# Patient Record
Sex: Female | Born: 1935 | Race: Black or African American | Hispanic: No | Smoking: Never smoker
Health system: Southern US, Community
[De-identification: ages and names within clinical notes are randomized; demographics above are authoritative.]

## PROBLEM LIST (undated history)

## (undated) DIAGNOSIS — C189 Malignant neoplasm of colon, unspecified: Secondary | ICD-10-CM

## (undated) DIAGNOSIS — I639 Cerebral infarction, unspecified: Secondary | ICD-10-CM

---

## 2011-09-02 DIAGNOSIS — D649 Anemia, unspecified: Secondary | ICD-10-CM | POA: Diagnosis not present

## 2011-09-02 DIAGNOSIS — I10 Essential (primary) hypertension: Secondary | ICD-10-CM | POA: Diagnosis not present

## 2011-09-02 DIAGNOSIS — Z79899 Other long term (current) drug therapy: Secondary | ICD-10-CM | POA: Diagnosis not present

## 2011-11-10 DIAGNOSIS — C189 Malignant neoplasm of colon, unspecified: Secondary | ICD-10-CM | POA: Diagnosis not present

## 2011-12-04 DIAGNOSIS — R5383 Other fatigue: Secondary | ICD-10-CM | POA: Diagnosis not present

## 2011-12-04 DIAGNOSIS — I10 Essential (primary) hypertension: Secondary | ICD-10-CM | POA: Diagnosis not present

## 2011-12-04 DIAGNOSIS — D649 Anemia, unspecified: Secondary | ICD-10-CM | POA: Diagnosis not present

## 2011-12-04 DIAGNOSIS — R5381 Other malaise: Secondary | ICD-10-CM | POA: Diagnosis not present

## 2011-12-25 DIAGNOSIS — M6281 Muscle weakness (generalized): Secondary | ICD-10-CM | POA: Diagnosis not present

## 2011-12-25 DIAGNOSIS — D649 Anemia, unspecified: Secondary | ICD-10-CM | POA: Diagnosis not present

## 2011-12-25 DIAGNOSIS — I10 Essential (primary) hypertension: Secondary | ICD-10-CM | POA: Diagnosis not present

## 2012-02-16 DIAGNOSIS — C189 Malignant neoplasm of colon, unspecified: Secondary | ICD-10-CM | POA: Diagnosis not present

## 2012-03-21 DIAGNOSIS — C189 Malignant neoplasm of colon, unspecified: Secondary | ICD-10-CM | POA: Diagnosis not present

## 2012-03-21 DIAGNOSIS — D649 Anemia, unspecified: Secondary | ICD-10-CM | POA: Diagnosis not present

## 2012-03-21 DIAGNOSIS — M6281 Muscle weakness (generalized): Secondary | ICD-10-CM | POA: Diagnosis not present

## 2012-04-11 DIAGNOSIS — R5381 Other malaise: Secondary | ICD-10-CM | POA: Diagnosis not present

## 2012-04-11 DIAGNOSIS — R5383 Other fatigue: Secondary | ICD-10-CM | POA: Diagnosis not present

## 2012-04-11 DIAGNOSIS — R030 Elevated blood-pressure reading, without diagnosis of hypertension: Secondary | ICD-10-CM | POA: Diagnosis not present

## 2012-04-11 DIAGNOSIS — M6281 Muscle weakness (generalized): Secondary | ICD-10-CM | POA: Diagnosis not present

## 2012-04-13 DIAGNOSIS — I10 Essential (primary) hypertension: Secondary | ICD-10-CM | POA: Diagnosis not present

## 2012-04-13 DIAGNOSIS — N390 Urinary tract infection, site not specified: Secondary | ICD-10-CM | POA: Diagnosis not present

## 2012-04-15 DIAGNOSIS — N390 Urinary tract infection, site not specified: Secondary | ICD-10-CM | POA: Diagnosis not present

## 2012-06-22 DIAGNOSIS — I10 Essential (primary) hypertension: Secondary | ICD-10-CM | POA: Diagnosis not present

## 2012-06-22 DIAGNOSIS — D649 Anemia, unspecified: Secondary | ICD-10-CM | POA: Diagnosis not present

## 2012-10-06 DIAGNOSIS — M6281 Muscle weakness (generalized): Secondary | ICD-10-CM | POA: Diagnosis not present

## 2012-10-06 DIAGNOSIS — R5381 Other malaise: Secondary | ICD-10-CM | POA: Diagnosis not present

## 2012-10-06 DIAGNOSIS — R5383 Other fatigue: Secondary | ICD-10-CM | POA: Diagnosis not present

## 2012-11-13 DIAGNOSIS — I4510 Unspecified right bundle-branch block: Secondary | ICD-10-CM | POA: Diagnosis not present

## 2012-11-13 DIAGNOSIS — Z85038 Personal history of other malignant neoplasm of large intestine: Secondary | ICD-10-CM | POA: Diagnosis not present

## 2012-11-13 DIAGNOSIS — R5381 Other malaise: Secondary | ICD-10-CM | POA: Diagnosis not present

## 2012-11-13 DIAGNOSIS — I10 Essential (primary) hypertension: Secondary | ICD-10-CM | POA: Diagnosis not present

## 2012-11-13 DIAGNOSIS — Z9119 Patient's noncompliance with other medical treatment and regimen: Secondary | ICD-10-CM | POA: Diagnosis not present

## 2012-11-13 DIAGNOSIS — I498 Other specified cardiac arrhythmias: Secondary | ICD-10-CM | POA: Diagnosis not present

## 2012-11-13 DIAGNOSIS — Z9049 Acquired absence of other specified parts of digestive tract: Secondary | ICD-10-CM | POA: Diagnosis not present

## 2012-11-13 DIAGNOSIS — R269 Unspecified abnormalities of gait and mobility: Secondary | ICD-10-CM | POA: Diagnosis not present

## 2012-11-14 DIAGNOSIS — I498 Other specified cardiac arrhythmias: Secondary | ICD-10-CM | POA: Diagnosis not present

## 2012-11-14 DIAGNOSIS — I4510 Unspecified right bundle-branch block: Secondary | ICD-10-CM | POA: Diagnosis not present

## 2012-12-20 DIAGNOSIS — C189 Malignant neoplasm of colon, unspecified: Secondary | ICD-10-CM | POA: Diagnosis not present

## 2013-03-23 DIAGNOSIS — C189 Malignant neoplasm of colon, unspecified: Secondary | ICD-10-CM | POA: Diagnosis not present

## 2013-06-09 DIAGNOSIS — C189 Malignant neoplasm of colon, unspecified: Secondary | ICD-10-CM | POA: Diagnosis not present

## 2013-12-26 DIAGNOSIS — C189 Malignant neoplasm of colon, unspecified: Secondary | ICD-10-CM | POA: Diagnosis not present

## 2014-01-14 DIAGNOSIS — R5383 Other fatigue: Secondary | ICD-10-CM | POA: Diagnosis not present

## 2014-01-14 DIAGNOSIS — I609 Nontraumatic subarachnoid hemorrhage, unspecified: Secondary | ICD-10-CM | POA: Diagnosis not present

## 2014-01-14 DIAGNOSIS — I10 Essential (primary) hypertension: Secondary | ICD-10-CM | POA: Diagnosis not present

## 2014-01-14 DIAGNOSIS — G40209 Localization-related (focal) (partial) symptomatic epilepsy and epileptic syndromes with complex partial seizures, not intractable, without status epilepticus: Secondary | ICD-10-CM | POA: Diagnosis not present

## 2014-01-14 DIAGNOSIS — S066X0A Traumatic subarachnoid hemorrhage without loss of consciousness, initial encounter: Secondary | ICD-10-CM | POA: Diagnosis not present

## 2014-01-14 DIAGNOSIS — Z9181 History of falling: Secondary | ICD-10-CM | POA: Diagnosis not present

## 2014-01-14 DIAGNOSIS — I6350 Cerebral infarction due to unspecified occlusion or stenosis of unspecified cerebral artery: Secondary | ICD-10-CM | POA: Diagnosis not present

## 2014-01-14 DIAGNOSIS — R1310 Dysphagia, unspecified: Secondary | ICD-10-CM | POA: Diagnosis not present

## 2014-01-14 DIAGNOSIS — I729 Aneurysm of unspecified site: Secondary | ICD-10-CM | POA: Diagnosis not present

## 2014-01-14 DIAGNOSIS — Z66 Do not resuscitate: Secondary | ICD-10-CM | POA: Diagnosis present

## 2014-01-14 DIAGNOSIS — I4510 Unspecified right bundle-branch block: Secondary | ICD-10-CM | POA: Diagnosis not present

## 2014-01-14 DIAGNOSIS — R404 Transient alteration of awareness: Secondary | ICD-10-CM | POA: Diagnosis not present

## 2014-01-14 DIAGNOSIS — G8190 Hemiplegia, unspecified affecting unspecified side: Secondary | ICD-10-CM | POA: Diagnosis not present

## 2014-01-14 DIAGNOSIS — R410 Disorientation, unspecified: Secondary | ICD-10-CM | POA: Diagnosis not present

## 2014-01-14 DIAGNOSIS — R5381 Other malaise: Secondary | ICD-10-CM | POA: Diagnosis not present

## 2014-01-14 DIAGNOSIS — I619 Nontraumatic intracerebral hemorrhage, unspecified: Secondary | ICD-10-CM | POA: Diagnosis not present

## 2014-01-14 DIAGNOSIS — Z85038 Personal history of other malignant neoplasm of large intestine: Secondary | ICD-10-CM | POA: Diagnosis not present

## 2014-01-14 DIAGNOSIS — S066X9A Traumatic subarachnoid hemorrhage with loss of consciousness of unspecified duration, initial encounter: Secondary | ICD-10-CM | POA: Diagnosis not present

## 2014-01-14 DIAGNOSIS — R269 Unspecified abnormalities of gait and mobility: Secondary | ICD-10-CM | POA: Diagnosis not present

## 2014-01-14 DIAGNOSIS — I498 Other specified cardiac arrhythmias: Secondary | ICD-10-CM | POA: Diagnosis not present

## 2014-01-14 DIAGNOSIS — S066XAA Traumatic subarachnoid hemorrhage with loss of consciousness status unknown, initial encounter: Secondary | ICD-10-CM | POA: Diagnosis not present

## 2014-01-14 DIAGNOSIS — I4589 Other specified conduction disorders: Secondary | ICD-10-CM | POA: Diagnosis not present

## 2014-01-14 DIAGNOSIS — I6200 Nontraumatic subdural hemorrhage, unspecified: Secondary | ICD-10-CM | POA: Diagnosis not present

## 2014-01-14 DIAGNOSIS — Z9049 Acquired absence of other specified parts of digestive tract: Secondary | ICD-10-CM | POA: Diagnosis not present

## 2014-01-14 DIAGNOSIS — R569 Unspecified convulsions: Secondary | ICD-10-CM | POA: Diagnosis not present

## 2014-01-19 DIAGNOSIS — G40209 Localization-related (focal) (partial) symptomatic epilepsy and epileptic syndromes with complex partial seizures, not intractable, without status epilepticus: Secondary | ICD-10-CM | POA: Diagnosis not present

## 2014-01-19 DIAGNOSIS — G08 Intracranial and intraspinal phlebitis and thrombophlebitis: Secondary | ICD-10-CM | POA: Diagnosis not present

## 2014-01-19 DIAGNOSIS — N390 Urinary tract infection, site not specified: Secondary | ICD-10-CM | POA: Diagnosis not present

## 2014-01-19 DIAGNOSIS — Z85038 Personal history of other malignant neoplasm of large intestine: Secondary | ICD-10-CM | POA: Diagnosis not present

## 2014-01-19 DIAGNOSIS — Z5189 Encounter for other specified aftercare: Secondary | ICD-10-CM | POA: Diagnosis not present

## 2014-01-19 DIAGNOSIS — R040 Epistaxis: Secondary | ICD-10-CM | POA: Diagnosis present

## 2014-01-19 DIAGNOSIS — I609 Nontraumatic subarachnoid hemorrhage, unspecified: Secondary | ICD-10-CM | POA: Diagnosis not present

## 2014-01-19 DIAGNOSIS — M6281 Muscle weakness (generalized): Secondary | ICD-10-CM | POA: Diagnosis not present

## 2014-01-19 DIAGNOSIS — I10 Essential (primary) hypertension: Secondary | ICD-10-CM | POA: Diagnosis not present

## 2014-01-19 DIAGNOSIS — K209 Esophagitis, unspecified without bleeding: Secondary | ICD-10-CM | POA: Diagnosis present

## 2014-01-19 DIAGNOSIS — R1310 Dysphagia, unspecified: Secondary | ICD-10-CM | POA: Diagnosis not present

## 2014-01-19 DIAGNOSIS — S066XAA Traumatic subarachnoid hemorrhage with loss of consciousness status unknown, initial encounter: Secondary | ICD-10-CM | POA: Diagnosis not present

## 2014-01-19 DIAGNOSIS — R1319 Other dysphagia: Secondary | ICD-10-CM | POA: Diagnosis present

## 2014-01-19 DIAGNOSIS — S065XAA Traumatic subdural hemorrhage with loss of consciousness status unknown, initial encounter: Secondary | ICD-10-CM | POA: Diagnosis not present

## 2014-01-19 DIAGNOSIS — I69998 Other sequelae following unspecified cerebrovascular disease: Secondary | ICD-10-CM | POA: Diagnosis not present

## 2014-01-19 DIAGNOSIS — I6789 Other cerebrovascular disease: Secondary | ICD-10-CM | POA: Diagnosis not present

## 2014-01-19 DIAGNOSIS — Z8673 Personal history of transient ischemic attack (TIA), and cerebral infarction without residual deficits: Secondary | ICD-10-CM | POA: Diagnosis not present

## 2014-01-19 DIAGNOSIS — Z9221 Personal history of antineoplastic chemotherapy: Secondary | ICD-10-CM | POA: Diagnosis not present

## 2014-01-19 DIAGNOSIS — F05 Delirium due to known physiological condition: Secondary | ICD-10-CM | POA: Diagnosis not present

## 2014-01-19 DIAGNOSIS — I6200 Nontraumatic subdural hemorrhage, unspecified: Secondary | ICD-10-CM | POA: Diagnosis not present

## 2014-01-19 DIAGNOSIS — Z4682 Encounter for fitting and adjustment of non-vascular catheter: Secondary | ICD-10-CM | POA: Diagnosis not present

## 2014-01-19 DIAGNOSIS — R569 Unspecified convulsions: Secondary | ICD-10-CM | POA: Diagnosis not present

## 2014-01-19 DIAGNOSIS — R279 Unspecified lack of coordination: Secondary | ICD-10-CM | POA: Diagnosis not present

## 2014-01-19 DIAGNOSIS — Z79899 Other long term (current) drug therapy: Secondary | ICD-10-CM | POA: Diagnosis not present

## 2014-01-19 DIAGNOSIS — I619 Nontraumatic intracerebral hemorrhage, unspecified: Secondary | ICD-10-CM | POA: Diagnosis not present

## 2014-01-19 DIAGNOSIS — C189 Malignant neoplasm of colon, unspecified: Secondary | ICD-10-CM | POA: Diagnosis not present

## 2014-01-19 DIAGNOSIS — S066X9A Traumatic subarachnoid hemorrhage with loss of consciousness of unspecified duration, initial encounter: Secondary | ICD-10-CM | POA: Diagnosis present

## 2014-01-19 DIAGNOSIS — S065X9A Traumatic subdural hemorrhage with loss of consciousness of unspecified duration, initial encounter: Secondary | ICD-10-CM | POA: Diagnosis not present

## 2014-01-19 DIAGNOSIS — G9340 Encephalopathy, unspecified: Secondary | ICD-10-CM | POA: Diagnosis present

## 2014-01-19 DIAGNOSIS — R1312 Dysphagia, oropharyngeal phase: Secondary | ICD-10-CM | POA: Diagnosis not present

## 2014-02-05 DIAGNOSIS — I10 Essential (primary) hypertension: Secondary | ICD-10-CM | POA: Diagnosis not present

## 2014-02-05 DIAGNOSIS — R1310 Dysphagia, unspecified: Secondary | ICD-10-CM | POA: Diagnosis not present

## 2014-02-05 DIAGNOSIS — Z7189 Other specified counseling: Secondary | ICD-10-CM | POA: Diagnosis not present

## 2014-02-05 DIAGNOSIS — R1311 Dysphagia, oral phase: Secondary | ICD-10-CM | POA: Diagnosis not present

## 2014-02-05 DIAGNOSIS — I639 Cerebral infarction, unspecified: Secondary | ICD-10-CM | POA: Diagnosis not present

## 2014-02-05 DIAGNOSIS — R633 Feeding difficulties: Secondary | ICD-10-CM | POA: Diagnosis not present

## 2014-02-05 DIAGNOSIS — I6789 Other cerebrovascular disease: Secondary | ICD-10-CM | POA: Diagnosis not present

## 2014-02-05 DIAGNOSIS — Z5189 Encounter for other specified aftercare: Secondary | ICD-10-CM | POA: Diagnosis not present

## 2014-02-05 DIAGNOSIS — K121 Other forms of stomatitis: Secondary | ICD-10-CM | POA: Diagnosis not present

## 2014-02-05 DIAGNOSIS — M19049 Primary osteoarthritis, unspecified hand: Secondary | ICD-10-CM | POA: Diagnosis not present

## 2014-02-05 DIAGNOSIS — R1312 Dysphagia, oropharyngeal phase: Secondary | ICD-10-CM | POA: Diagnosis not present

## 2014-02-05 DIAGNOSIS — I6990 Unspecified sequelae of unspecified cerebrovascular disease: Secondary | ICD-10-CM | POA: Diagnosis not present

## 2014-02-05 DIAGNOSIS — R0989 Other specified symptoms and signs involving the circulatory and respiratory systems: Secondary | ICD-10-CM | POA: Diagnosis not present

## 2014-02-05 DIAGNOSIS — E7800 Pure hypercholesterolemia, unspecified: Secondary | ICD-10-CM | POA: Diagnosis not present

## 2014-02-05 DIAGNOSIS — L8990 Pressure ulcer of unspecified site, unspecified stage: Secondary | ICD-10-CM | POA: Diagnosis not present

## 2014-02-05 DIAGNOSIS — R059 Cough, unspecified: Secondary | ICD-10-CM | POA: Diagnosis not present

## 2014-02-05 DIAGNOSIS — L89109 Pressure ulcer of unspecified part of back, unspecified stage: Secondary | ICD-10-CM | POA: Diagnosis not present

## 2014-02-05 DIAGNOSIS — I69998 Other sequelae following unspecified cerebrovascular disease: Secondary | ICD-10-CM | POA: Diagnosis not present

## 2014-02-05 DIAGNOSIS — I69320 Aphasia following cerebral infarction: Secondary | ICD-10-CM | POA: Diagnosis not present

## 2014-02-05 DIAGNOSIS — I609 Nontraumatic subarachnoid hemorrhage, unspecified: Secondary | ICD-10-CM | POA: Diagnosis not present

## 2014-02-05 DIAGNOSIS — F05 Delirium due to known physiological condition: Secondary | ICD-10-CM | POA: Diagnosis not present

## 2014-02-05 DIAGNOSIS — G08 Intracranial and intraspinal phlebitis and thrombophlebitis: Secondary | ICD-10-CM | POA: Diagnosis not present

## 2014-02-05 DIAGNOSIS — R279 Unspecified lack of coordination: Secondary | ICD-10-CM | POA: Diagnosis not present

## 2014-02-05 DIAGNOSIS — M25449 Effusion, unspecified hand: Secondary | ICD-10-CM | POA: Diagnosis not present

## 2014-02-05 DIAGNOSIS — M25539 Pain in unspecified wrist: Secondary | ICD-10-CM | POA: Diagnosis not present

## 2014-02-05 DIAGNOSIS — M6281 Muscle weakness (generalized): Secondary | ICD-10-CM | POA: Diagnosis not present

## 2014-02-05 DIAGNOSIS — C189 Malignant neoplasm of colon, unspecified: Secondary | ICD-10-CM | POA: Diagnosis not present

## 2014-02-05 DIAGNOSIS — N390 Urinary tract infection, site not specified: Secondary | ICD-10-CM | POA: Diagnosis not present

## 2014-02-07 DIAGNOSIS — I6990 Unspecified sequelae of unspecified cerebrovascular disease: Secondary | ICD-10-CM | POA: Diagnosis not present

## 2014-02-07 DIAGNOSIS — I10 Essential (primary) hypertension: Secondary | ICD-10-CM | POA: Diagnosis not present

## 2014-02-11 DIAGNOSIS — R0989 Other specified symptoms and signs involving the circulatory and respiratory systems: Secondary | ICD-10-CM | POA: Diagnosis not present

## 2014-02-11 DIAGNOSIS — R05 Cough: Secondary | ICD-10-CM | POA: Diagnosis not present

## 2014-02-19 DIAGNOSIS — L8990 Pressure ulcer of unspecified site, unspecified stage: Secondary | ICD-10-CM | POA: Diagnosis not present

## 2014-02-19 DIAGNOSIS — L89109 Pressure ulcer of unspecified part of back, unspecified stage: Secondary | ICD-10-CM | POA: Diagnosis not present

## 2014-02-26 DIAGNOSIS — E7800 Pure hypercholesterolemia, unspecified: Secondary | ICD-10-CM | POA: Diagnosis not present

## 2014-03-04 DIAGNOSIS — M25449 Effusion, unspecified hand: Secondary | ICD-10-CM | POA: Diagnosis not present

## 2014-03-04 DIAGNOSIS — M19049 Primary osteoarthritis, unspecified hand: Secondary | ICD-10-CM | POA: Diagnosis not present

## 2014-03-06 DIAGNOSIS — Z7189 Other specified counseling: Secondary | ICD-10-CM | POA: Diagnosis not present

## 2014-03-06 DIAGNOSIS — I10 Essential (primary) hypertension: Secondary | ICD-10-CM | POA: Diagnosis not present

## 2014-03-06 DIAGNOSIS — I69998 Other sequelae following unspecified cerebrovascular disease: Secondary | ICD-10-CM | POA: Diagnosis not present

## 2014-03-06 DIAGNOSIS — Z5189 Encounter for other specified aftercare: Secondary | ICD-10-CM | POA: Diagnosis not present

## 2014-03-06 DIAGNOSIS — L8990 Pressure ulcer of unspecified site, unspecified stage: Secondary | ICD-10-CM | POA: Diagnosis not present

## 2014-03-06 DIAGNOSIS — M25539 Pain in unspecified wrist: Secondary | ICD-10-CM | POA: Diagnosis not present

## 2014-03-06 DIAGNOSIS — R0989 Other specified symptoms and signs involving the circulatory and respiratory systems: Secondary | ICD-10-CM | POA: Diagnosis not present

## 2014-03-06 DIAGNOSIS — G08 Intracranial and intraspinal phlebitis and thrombophlebitis: Secondary | ICD-10-CM | POA: Diagnosis not present

## 2014-03-06 DIAGNOSIS — R1312 Dysphagia, oropharyngeal phase: Secondary | ICD-10-CM | POA: Diagnosis not present

## 2014-03-06 DIAGNOSIS — L89109 Pressure ulcer of unspecified part of back, unspecified stage: Secondary | ICD-10-CM | POA: Diagnosis not present

## 2014-03-06 DIAGNOSIS — I609 Nontraumatic subarachnoid hemorrhage, unspecified: Secondary | ICD-10-CM | POA: Diagnosis not present

## 2014-03-06 DIAGNOSIS — C189 Malignant neoplasm of colon, unspecified: Secondary | ICD-10-CM | POA: Diagnosis not present

## 2014-03-06 DIAGNOSIS — R279 Unspecified lack of coordination: Secondary | ICD-10-CM | POA: Diagnosis not present

## 2014-03-06 DIAGNOSIS — M6281 Muscle weakness (generalized): Secondary | ICD-10-CM | POA: Diagnosis not present

## 2014-03-06 DIAGNOSIS — I6789 Other cerebrovascular disease: Secondary | ICD-10-CM | POA: Diagnosis not present

## 2014-03-06 DIAGNOSIS — R633 Feeding difficulties: Secondary | ICD-10-CM | POA: Diagnosis not present

## 2014-03-06 DIAGNOSIS — I639 Cerebral infarction, unspecified: Secondary | ICD-10-CM | POA: Diagnosis not present

## 2014-03-06 DIAGNOSIS — R1311 Dysphagia, oral phase: Secondary | ICD-10-CM | POA: Diagnosis not present

## 2014-03-06 DIAGNOSIS — K121 Other forms of stomatitis: Secondary | ICD-10-CM | POA: Diagnosis not present

## 2014-03-06 DIAGNOSIS — I69320 Aphasia following cerebral infarction: Secondary | ICD-10-CM | POA: Diagnosis not present

## 2014-03-06 DIAGNOSIS — I6990 Unspecified sequelae of unspecified cerebrovascular disease: Secondary | ICD-10-CM | POA: Diagnosis not present

## 2014-03-06 DIAGNOSIS — R1310 Dysphagia, unspecified: Secondary | ICD-10-CM | POA: Diagnosis not present

## 2014-03-15 DIAGNOSIS — I6990 Unspecified sequelae of unspecified cerebrovascular disease: Secondary | ICD-10-CM | POA: Diagnosis not present

## 2014-03-15 DIAGNOSIS — L8990 Pressure ulcer of unspecified site, unspecified stage: Secondary | ICD-10-CM | POA: Diagnosis not present

## 2014-03-15 DIAGNOSIS — L89109 Pressure ulcer of unspecified part of back, unspecified stage: Secondary | ICD-10-CM | POA: Diagnosis not present

## 2014-04-19 DIAGNOSIS — Z7189 Other specified counseling: Secondary | ICD-10-CM | POA: Diagnosis not present

## 2014-04-19 DIAGNOSIS — I69320 Aphasia following cerebral infarction: Secondary | ICD-10-CM | POA: Diagnosis not present

## 2014-05-03 ENCOUNTER — Other Ambulatory Visit (HOSPITAL_COMMUNITY): Payer: Self-pay | Admitting: Geriatric Medicine

## 2014-05-03 DIAGNOSIS — R1310 Dysphagia, unspecified: Secondary | ICD-10-CM

## 2014-05-09 ENCOUNTER — Ambulatory Visit (HOSPITAL_COMMUNITY): Admission: RE | Admit: 2014-05-09 | Payer: Self-pay | Source: Ambulatory Visit

## 2014-05-09 ENCOUNTER — Inpatient Hospital Stay (HOSPITAL_COMMUNITY): Admission: RE | Admit: 2014-05-09 | Payer: Self-pay | Source: Ambulatory Visit

## 2014-05-09 DIAGNOSIS — R633 Feeding difficulties: Secondary | ICD-10-CM | POA: Diagnosis not present

## 2014-05-09 DIAGNOSIS — R1311 Dysphagia, oral phase: Secondary | ICD-10-CM | POA: Diagnosis not present

## 2014-05-15 DIAGNOSIS — I609 Nontraumatic subarachnoid hemorrhage, unspecified: Secondary | ICD-10-CM | POA: Diagnosis not present

## 2014-05-15 DIAGNOSIS — C189 Malignant neoplasm of colon, unspecified: Secondary | ICD-10-CM | POA: Diagnosis not present

## 2014-05-15 DIAGNOSIS — I10 Essential (primary) hypertension: Secondary | ICD-10-CM | POA: Diagnosis not present

## 2014-05-15 DIAGNOSIS — G08 Intracranial and intraspinal phlebitis and thrombophlebitis: Secondary | ICD-10-CM | POA: Diagnosis not present

## 2014-05-16 DIAGNOSIS — R279 Unspecified lack of coordination: Secondary | ICD-10-CM | POA: Diagnosis not present

## 2014-05-16 DIAGNOSIS — I6789 Other cerebrovascular disease: Secondary | ICD-10-CM | POA: Diagnosis not present

## 2014-05-16 DIAGNOSIS — M6281 Muscle weakness (generalized): Secondary | ICD-10-CM | POA: Diagnosis not present

## 2014-05-16 DIAGNOSIS — I69998 Other sequelae following unspecified cerebrovascular disease: Secondary | ICD-10-CM | POA: Diagnosis not present

## 2014-05-16 DIAGNOSIS — R1310 Dysphagia, unspecified: Secondary | ICD-10-CM | POA: Diagnosis not present

## 2014-05-16 DIAGNOSIS — R1311 Dysphagia, oral phase: Secondary | ICD-10-CM | POA: Diagnosis not present

## 2014-05-17 DIAGNOSIS — R1310 Dysphagia, unspecified: Secondary | ICD-10-CM | POA: Diagnosis not present

## 2014-05-17 DIAGNOSIS — R279 Unspecified lack of coordination: Secondary | ICD-10-CM | POA: Diagnosis not present

## 2014-05-17 DIAGNOSIS — M6281 Muscle weakness (generalized): Secondary | ICD-10-CM | POA: Diagnosis not present

## 2014-05-17 DIAGNOSIS — I69998 Other sequelae following unspecified cerebrovascular disease: Secondary | ICD-10-CM | POA: Diagnosis not present

## 2014-05-17 DIAGNOSIS — R1311 Dysphagia, oral phase: Secondary | ICD-10-CM | POA: Diagnosis not present

## 2014-05-17 DIAGNOSIS — I6789 Other cerebrovascular disease: Secondary | ICD-10-CM | POA: Diagnosis not present

## 2014-05-18 DIAGNOSIS — R1310 Dysphagia, unspecified: Secondary | ICD-10-CM | POA: Diagnosis not present

## 2014-05-18 DIAGNOSIS — R1311 Dysphagia, oral phase: Secondary | ICD-10-CM | POA: Diagnosis not present

## 2014-05-18 DIAGNOSIS — I69998 Other sequelae following unspecified cerebrovascular disease: Secondary | ICD-10-CM | POA: Diagnosis not present

## 2014-05-18 DIAGNOSIS — I6789 Other cerebrovascular disease: Secondary | ICD-10-CM | POA: Diagnosis not present

## 2014-05-18 DIAGNOSIS — R279 Unspecified lack of coordination: Secondary | ICD-10-CM | POA: Diagnosis not present

## 2014-05-18 DIAGNOSIS — M6281 Muscle weakness (generalized): Secondary | ICD-10-CM | POA: Diagnosis not present

## 2014-05-21 DIAGNOSIS — R1311 Dysphagia, oral phase: Secondary | ICD-10-CM | POA: Diagnosis not present

## 2014-05-21 DIAGNOSIS — R279 Unspecified lack of coordination: Secondary | ICD-10-CM | POA: Diagnosis not present

## 2014-05-21 DIAGNOSIS — I69998 Other sequelae following unspecified cerebrovascular disease: Secondary | ICD-10-CM | POA: Diagnosis not present

## 2014-05-21 DIAGNOSIS — R1310 Dysphagia, unspecified: Secondary | ICD-10-CM | POA: Diagnosis not present

## 2014-05-21 DIAGNOSIS — M6281 Muscle weakness (generalized): Secondary | ICD-10-CM | POA: Diagnosis not present

## 2014-05-21 DIAGNOSIS — I6789 Other cerebrovascular disease: Secondary | ICD-10-CM | POA: Diagnosis not present

## 2014-05-22 DIAGNOSIS — M6281 Muscle weakness (generalized): Secondary | ICD-10-CM | POA: Diagnosis not present

## 2014-05-22 DIAGNOSIS — I6789 Other cerebrovascular disease: Secondary | ICD-10-CM | POA: Diagnosis not present

## 2014-05-22 DIAGNOSIS — I69998 Other sequelae following unspecified cerebrovascular disease: Secondary | ICD-10-CM | POA: Diagnosis not present

## 2014-05-22 DIAGNOSIS — R1311 Dysphagia, oral phase: Secondary | ICD-10-CM | POA: Diagnosis not present

## 2014-05-22 DIAGNOSIS — R1310 Dysphagia, unspecified: Secondary | ICD-10-CM | POA: Diagnosis not present

## 2014-05-22 DIAGNOSIS — R279 Unspecified lack of coordination: Secondary | ICD-10-CM | POA: Diagnosis not present

## 2014-05-23 DIAGNOSIS — R279 Unspecified lack of coordination: Secondary | ICD-10-CM | POA: Diagnosis not present

## 2014-05-23 DIAGNOSIS — M6281 Muscle weakness (generalized): Secondary | ICD-10-CM | POA: Diagnosis not present

## 2014-05-23 DIAGNOSIS — I69998 Other sequelae following unspecified cerebrovascular disease: Secondary | ICD-10-CM | POA: Diagnosis not present

## 2014-05-23 DIAGNOSIS — R1311 Dysphagia, oral phase: Secondary | ICD-10-CM | POA: Diagnosis not present

## 2014-05-23 DIAGNOSIS — I6789 Other cerebrovascular disease: Secondary | ICD-10-CM | POA: Diagnosis not present

## 2014-05-23 DIAGNOSIS — R1310 Dysphagia, unspecified: Secondary | ICD-10-CM | POA: Diagnosis not present

## 2014-05-24 DIAGNOSIS — M6281 Muscle weakness (generalized): Secondary | ICD-10-CM | POA: Diagnosis not present

## 2014-05-24 DIAGNOSIS — I6789 Other cerebrovascular disease: Secondary | ICD-10-CM | POA: Diagnosis not present

## 2014-05-24 DIAGNOSIS — R279 Unspecified lack of coordination: Secondary | ICD-10-CM | POA: Diagnosis not present

## 2014-05-24 DIAGNOSIS — I69998 Other sequelae following unspecified cerebrovascular disease: Secondary | ICD-10-CM | POA: Diagnosis not present

## 2014-05-24 DIAGNOSIS — R1310 Dysphagia, unspecified: Secondary | ICD-10-CM | POA: Diagnosis not present

## 2014-05-24 DIAGNOSIS — R1311 Dysphagia, oral phase: Secondary | ICD-10-CM | POA: Diagnosis not present

## 2014-05-25 DIAGNOSIS — R1310 Dysphagia, unspecified: Secondary | ICD-10-CM | POA: Diagnosis not present

## 2014-05-25 DIAGNOSIS — M6281 Muscle weakness (generalized): Secondary | ICD-10-CM | POA: Diagnosis not present

## 2014-05-25 DIAGNOSIS — R279 Unspecified lack of coordination: Secondary | ICD-10-CM | POA: Diagnosis not present

## 2014-05-25 DIAGNOSIS — R1311 Dysphagia, oral phase: Secondary | ICD-10-CM | POA: Diagnosis not present

## 2014-05-25 DIAGNOSIS — I6789 Other cerebrovascular disease: Secondary | ICD-10-CM | POA: Diagnosis not present

## 2014-05-25 DIAGNOSIS — I69998 Other sequelae following unspecified cerebrovascular disease: Secondary | ICD-10-CM | POA: Diagnosis not present

## 2014-05-26 DIAGNOSIS — I6789 Other cerebrovascular disease: Secondary | ICD-10-CM | POA: Diagnosis not present

## 2014-05-26 DIAGNOSIS — R279 Unspecified lack of coordination: Secondary | ICD-10-CM | POA: Diagnosis not present

## 2014-05-26 DIAGNOSIS — R1310 Dysphagia, unspecified: Secondary | ICD-10-CM | POA: Diagnosis not present

## 2014-05-26 DIAGNOSIS — M6281 Muscle weakness (generalized): Secondary | ICD-10-CM | POA: Diagnosis not present

## 2014-05-26 DIAGNOSIS — R1311 Dysphagia, oral phase: Secondary | ICD-10-CM | POA: Diagnosis not present

## 2014-05-26 DIAGNOSIS — I69998 Other sequelae following unspecified cerebrovascular disease: Secondary | ICD-10-CM | POA: Diagnosis not present

## 2014-05-28 DIAGNOSIS — M6281 Muscle weakness (generalized): Secondary | ICD-10-CM | POA: Diagnosis not present

## 2014-05-28 DIAGNOSIS — I69998 Other sequelae following unspecified cerebrovascular disease: Secondary | ICD-10-CM | POA: Diagnosis not present

## 2014-05-28 DIAGNOSIS — R279 Unspecified lack of coordination: Secondary | ICD-10-CM | POA: Diagnosis not present

## 2014-05-28 DIAGNOSIS — R1310 Dysphagia, unspecified: Secondary | ICD-10-CM | POA: Diagnosis not present

## 2014-05-28 DIAGNOSIS — R1311 Dysphagia, oral phase: Secondary | ICD-10-CM | POA: Diagnosis not present

## 2014-05-28 DIAGNOSIS — I6789 Other cerebrovascular disease: Secondary | ICD-10-CM | POA: Diagnosis not present

## 2014-05-29 DIAGNOSIS — I6789 Other cerebrovascular disease: Secondary | ICD-10-CM | POA: Diagnosis not present

## 2014-05-29 DIAGNOSIS — R1311 Dysphagia, oral phase: Secondary | ICD-10-CM | POA: Diagnosis not present

## 2014-05-29 DIAGNOSIS — I69998 Other sequelae following unspecified cerebrovascular disease: Secondary | ICD-10-CM | POA: Diagnosis not present

## 2014-05-29 DIAGNOSIS — R279 Unspecified lack of coordination: Secondary | ICD-10-CM | POA: Diagnosis not present

## 2014-05-29 DIAGNOSIS — R1310 Dysphagia, unspecified: Secondary | ICD-10-CM | POA: Diagnosis not present

## 2014-05-29 DIAGNOSIS — M6281 Muscle weakness (generalized): Secondary | ICD-10-CM | POA: Diagnosis not present

## 2014-05-30 DIAGNOSIS — R1310 Dysphagia, unspecified: Secondary | ICD-10-CM | POA: Diagnosis not present

## 2014-05-30 DIAGNOSIS — I69998 Other sequelae following unspecified cerebrovascular disease: Secondary | ICD-10-CM | POA: Diagnosis not present

## 2014-05-30 DIAGNOSIS — R1311 Dysphagia, oral phase: Secondary | ICD-10-CM | POA: Diagnosis not present

## 2014-05-30 DIAGNOSIS — M6281 Muscle weakness (generalized): Secondary | ICD-10-CM | POA: Diagnosis not present

## 2014-05-30 DIAGNOSIS — I6789 Other cerebrovascular disease: Secondary | ICD-10-CM | POA: Diagnosis not present

## 2014-05-30 DIAGNOSIS — R279 Unspecified lack of coordination: Secondary | ICD-10-CM | POA: Diagnosis not present

## 2014-06-01 DIAGNOSIS — M6281 Muscle weakness (generalized): Secondary | ICD-10-CM | POA: Diagnosis not present

## 2014-06-01 DIAGNOSIS — R1311 Dysphagia, oral phase: Secondary | ICD-10-CM | POA: Diagnosis not present

## 2014-06-01 DIAGNOSIS — I69998 Other sequelae following unspecified cerebrovascular disease: Secondary | ICD-10-CM | POA: Diagnosis not present

## 2014-06-01 DIAGNOSIS — R279 Unspecified lack of coordination: Secondary | ICD-10-CM | POA: Diagnosis not present

## 2014-06-01 DIAGNOSIS — I6789 Other cerebrovascular disease: Secondary | ICD-10-CM | POA: Diagnosis not present

## 2014-06-01 DIAGNOSIS — R1310 Dysphagia, unspecified: Secondary | ICD-10-CM | POA: Diagnosis not present

## 2014-06-02 DIAGNOSIS — R279 Unspecified lack of coordination: Secondary | ICD-10-CM | POA: Diagnosis not present

## 2014-06-02 DIAGNOSIS — I6789 Other cerebrovascular disease: Secondary | ICD-10-CM | POA: Diagnosis not present

## 2014-06-02 DIAGNOSIS — R1311 Dysphagia, oral phase: Secondary | ICD-10-CM | POA: Diagnosis not present

## 2014-06-02 DIAGNOSIS — I69998 Other sequelae following unspecified cerebrovascular disease: Secondary | ICD-10-CM | POA: Diagnosis not present

## 2014-06-02 DIAGNOSIS — M6281 Muscle weakness (generalized): Secondary | ICD-10-CM | POA: Diagnosis not present

## 2014-06-02 DIAGNOSIS — R1310 Dysphagia, unspecified: Secondary | ICD-10-CM | POA: Diagnosis not present

## 2014-06-03 DIAGNOSIS — I6789 Other cerebrovascular disease: Secondary | ICD-10-CM | POA: Diagnosis not present

## 2014-06-03 DIAGNOSIS — I69998 Other sequelae following unspecified cerebrovascular disease: Secondary | ICD-10-CM | POA: Diagnosis not present

## 2014-06-03 DIAGNOSIS — R1310 Dysphagia, unspecified: Secondary | ICD-10-CM | POA: Diagnosis not present

## 2014-06-03 DIAGNOSIS — M6281 Muscle weakness (generalized): Secondary | ICD-10-CM | POA: Diagnosis not present

## 2014-06-03 DIAGNOSIS — R279 Unspecified lack of coordination: Secondary | ICD-10-CM | POA: Diagnosis not present

## 2014-06-03 DIAGNOSIS — R1311 Dysphagia, oral phase: Secondary | ICD-10-CM | POA: Diagnosis not present

## 2014-06-04 DIAGNOSIS — M6281 Muscle weakness (generalized): Secondary | ICD-10-CM | POA: Diagnosis not present

## 2014-06-04 DIAGNOSIS — R1311 Dysphagia, oral phase: Secondary | ICD-10-CM | POA: Diagnosis not present

## 2014-06-04 DIAGNOSIS — I69998 Other sequelae following unspecified cerebrovascular disease: Secondary | ICD-10-CM | POA: Diagnosis not present

## 2014-06-04 DIAGNOSIS — I6789 Other cerebrovascular disease: Secondary | ICD-10-CM | POA: Diagnosis not present

## 2014-06-04 DIAGNOSIS — R1310 Dysphagia, unspecified: Secondary | ICD-10-CM | POA: Diagnosis not present

## 2014-06-04 DIAGNOSIS — R279 Unspecified lack of coordination: Secondary | ICD-10-CM | POA: Diagnosis not present

## 2014-06-05 DIAGNOSIS — I6789 Other cerebrovascular disease: Secondary | ICD-10-CM | POA: Diagnosis not present

## 2014-06-05 DIAGNOSIS — R1311 Dysphagia, oral phase: Secondary | ICD-10-CM | POA: Diagnosis not present

## 2014-06-05 DIAGNOSIS — I639 Cerebral infarction, unspecified: Secondary | ICD-10-CM | POA: Diagnosis not present

## 2014-06-05 DIAGNOSIS — M6281 Muscle weakness (generalized): Secondary | ICD-10-CM | POA: Diagnosis not present

## 2014-06-05 DIAGNOSIS — R1310 Dysphagia, unspecified: Secondary | ICD-10-CM | POA: Diagnosis not present

## 2014-06-05 DIAGNOSIS — I69998 Other sequelae following unspecified cerebrovascular disease: Secondary | ICD-10-CM | POA: Diagnosis not present

## 2014-06-05 DIAGNOSIS — R279 Unspecified lack of coordination: Secondary | ICD-10-CM | POA: Diagnosis not present

## 2014-06-06 DIAGNOSIS — R1311 Dysphagia, oral phase: Secondary | ICD-10-CM | POA: Diagnosis not present

## 2014-06-06 DIAGNOSIS — R279 Unspecified lack of coordination: Secondary | ICD-10-CM | POA: Diagnosis not present

## 2014-06-06 DIAGNOSIS — I6789 Other cerebrovascular disease: Secondary | ICD-10-CM | POA: Diagnosis not present

## 2014-06-06 DIAGNOSIS — R1310 Dysphagia, unspecified: Secondary | ICD-10-CM | POA: Diagnosis not present

## 2014-06-06 DIAGNOSIS — M6281 Muscle weakness (generalized): Secondary | ICD-10-CM | POA: Diagnosis not present

## 2014-06-06 DIAGNOSIS — I69998 Other sequelae following unspecified cerebrovascular disease: Secondary | ICD-10-CM | POA: Diagnosis not present

## 2014-06-07 DIAGNOSIS — R1311 Dysphagia, oral phase: Secondary | ICD-10-CM | POA: Diagnosis not present

## 2014-06-07 DIAGNOSIS — R1310 Dysphagia, unspecified: Secondary | ICD-10-CM | POA: Diagnosis not present

## 2014-06-07 DIAGNOSIS — M6281 Muscle weakness (generalized): Secondary | ICD-10-CM | POA: Diagnosis not present

## 2014-06-07 DIAGNOSIS — I6789 Other cerebrovascular disease: Secondary | ICD-10-CM | POA: Diagnosis not present

## 2014-06-07 DIAGNOSIS — R279 Unspecified lack of coordination: Secondary | ICD-10-CM | POA: Diagnosis not present

## 2014-06-07 DIAGNOSIS — I69998 Other sequelae following unspecified cerebrovascular disease: Secondary | ICD-10-CM | POA: Diagnosis not present

## 2014-06-08 DIAGNOSIS — I69998 Other sequelae following unspecified cerebrovascular disease: Secondary | ICD-10-CM | POA: Diagnosis not present

## 2014-06-08 DIAGNOSIS — R1311 Dysphagia, oral phase: Secondary | ICD-10-CM | POA: Diagnosis not present

## 2014-06-08 DIAGNOSIS — R918 Other nonspecific abnormal finding of lung field: Secondary | ICD-10-CM | POA: Diagnosis not present

## 2014-06-08 DIAGNOSIS — K9423 Gastrostomy malfunction: Secondary | ICD-10-CM | POA: Diagnosis not present

## 2014-06-08 DIAGNOSIS — I6789 Other cerebrovascular disease: Secondary | ICD-10-CM | POA: Diagnosis not present

## 2014-06-08 DIAGNOSIS — R1310 Dysphagia, unspecified: Secondary | ICD-10-CM | POA: Diagnosis not present

## 2014-06-08 DIAGNOSIS — M6281 Muscle weakness (generalized): Secondary | ICD-10-CM | POA: Diagnosis not present

## 2014-06-08 DIAGNOSIS — Z8673 Personal history of transient ischemic attack (TIA), and cerebral infarction without residual deficits: Secondary | ICD-10-CM | POA: Diagnosis not present

## 2014-06-08 DIAGNOSIS — E860 Dehydration: Secondary | ICD-10-CM | POA: Diagnosis not present

## 2014-06-08 DIAGNOSIS — R279 Unspecified lack of coordination: Secondary | ICD-10-CM | POA: Diagnosis not present

## 2014-06-08 DIAGNOSIS — E861 Hypovolemia: Secondary | ICD-10-CM | POA: Diagnosis not present

## 2014-06-09 DIAGNOSIS — R1311 Dysphagia, oral phase: Secondary | ICD-10-CM | POA: Diagnosis not present

## 2014-06-09 DIAGNOSIS — I6789 Other cerebrovascular disease: Secondary | ICD-10-CM | POA: Diagnosis not present

## 2014-06-09 DIAGNOSIS — R279 Unspecified lack of coordination: Secondary | ICD-10-CM | POA: Diagnosis not present

## 2014-06-09 DIAGNOSIS — R112 Nausea with vomiting, unspecified: Secondary | ICD-10-CM | POA: Diagnosis not present

## 2014-06-09 DIAGNOSIS — E860 Dehydration: Secondary | ICD-10-CM | POA: Diagnosis not present

## 2014-06-09 DIAGNOSIS — R1310 Dysphagia, unspecified: Secondary | ICD-10-CM | POA: Diagnosis not present

## 2014-06-09 DIAGNOSIS — I69998 Other sequelae following unspecified cerebrovascular disease: Secondary | ICD-10-CM | POA: Diagnosis not present

## 2014-06-09 DIAGNOSIS — M6281 Muscle weakness (generalized): Secondary | ICD-10-CM | POA: Diagnosis not present

## 2014-06-09 DIAGNOSIS — K9423 Gastrostomy malfunction: Secondary | ICD-10-CM | POA: Diagnosis not present

## 2014-06-09 DIAGNOSIS — E861 Hypovolemia: Secondary | ICD-10-CM | POA: Diagnosis not present

## 2014-06-10 DIAGNOSIS — K121 Other forms of stomatitis: Secondary | ICD-10-CM | POA: Diagnosis not present

## 2014-06-11 DIAGNOSIS — M6281 Muscle weakness (generalized): Secondary | ICD-10-CM | POA: Diagnosis not present

## 2014-06-11 DIAGNOSIS — I69998 Other sequelae following unspecified cerebrovascular disease: Secondary | ICD-10-CM | POA: Diagnosis not present

## 2014-06-11 DIAGNOSIS — I6789 Other cerebrovascular disease: Secondary | ICD-10-CM | POA: Diagnosis not present

## 2014-06-11 DIAGNOSIS — R1311 Dysphagia, oral phase: Secondary | ICD-10-CM | POA: Diagnosis not present

## 2014-06-11 DIAGNOSIS — R1310 Dysphagia, unspecified: Secondary | ICD-10-CM | POA: Diagnosis not present

## 2014-06-11 DIAGNOSIS — R279 Unspecified lack of coordination: Secondary | ICD-10-CM | POA: Diagnosis not present

## 2014-06-12 DIAGNOSIS — K9420 Gastrostomy complication, unspecified: Secondary | ICD-10-CM | POA: Diagnosis not present

## 2014-06-12 DIAGNOSIS — I69998 Other sequelae following unspecified cerebrovascular disease: Secondary | ICD-10-CM | POA: Diagnosis not present

## 2014-06-12 DIAGNOSIS — R279 Unspecified lack of coordination: Secondary | ICD-10-CM | POA: Diagnosis not present

## 2014-06-12 DIAGNOSIS — M6281 Muscle weakness (generalized): Secondary | ICD-10-CM | POA: Diagnosis not present

## 2014-06-12 DIAGNOSIS — R1310 Dysphagia, unspecified: Secondary | ICD-10-CM | POA: Diagnosis not present

## 2014-06-12 DIAGNOSIS — R1311 Dysphagia, oral phase: Secondary | ICD-10-CM | POA: Diagnosis not present

## 2014-06-12 DIAGNOSIS — I6789 Other cerebrovascular disease: Secondary | ICD-10-CM | POA: Diagnosis not present

## 2014-06-13 DIAGNOSIS — R1310 Dysphagia, unspecified: Secondary | ICD-10-CM | POA: Diagnosis not present

## 2014-06-13 DIAGNOSIS — M6281 Muscle weakness (generalized): Secondary | ICD-10-CM | POA: Diagnosis not present

## 2014-06-13 DIAGNOSIS — R1311 Dysphagia, oral phase: Secondary | ICD-10-CM | POA: Diagnosis not present

## 2014-06-13 DIAGNOSIS — I6789 Other cerebrovascular disease: Secondary | ICD-10-CM | POA: Diagnosis not present

## 2014-06-13 DIAGNOSIS — R279 Unspecified lack of coordination: Secondary | ICD-10-CM | POA: Diagnosis not present

## 2014-06-13 DIAGNOSIS — I69998 Other sequelae following unspecified cerebrovascular disease: Secondary | ICD-10-CM | POA: Diagnosis not present

## 2014-06-14 DIAGNOSIS — R279 Unspecified lack of coordination: Secondary | ICD-10-CM | POA: Diagnosis not present

## 2014-06-14 DIAGNOSIS — R1311 Dysphagia, oral phase: Secondary | ICD-10-CM | POA: Diagnosis not present

## 2014-06-14 DIAGNOSIS — R1310 Dysphagia, unspecified: Secondary | ICD-10-CM | POA: Diagnosis not present

## 2014-06-14 DIAGNOSIS — M6281 Muscle weakness (generalized): Secondary | ICD-10-CM | POA: Diagnosis not present

## 2014-06-14 DIAGNOSIS — I69998 Other sequelae following unspecified cerebrovascular disease: Secondary | ICD-10-CM | POA: Diagnosis not present

## 2014-06-14 DIAGNOSIS — I6789 Other cerebrovascular disease: Secondary | ICD-10-CM | POA: Diagnosis not present

## 2014-06-15 DIAGNOSIS — R279 Unspecified lack of coordination: Secondary | ICD-10-CM | POA: Diagnosis not present

## 2014-06-15 DIAGNOSIS — M6281 Muscle weakness (generalized): Secondary | ICD-10-CM | POA: Diagnosis not present

## 2014-06-15 DIAGNOSIS — R1310 Dysphagia, unspecified: Secondary | ICD-10-CM | POA: Diagnosis not present

## 2014-06-15 DIAGNOSIS — R1311 Dysphagia, oral phase: Secondary | ICD-10-CM | POA: Diagnosis not present

## 2014-06-15 DIAGNOSIS — I69998 Other sequelae following unspecified cerebrovascular disease: Secondary | ICD-10-CM | POA: Diagnosis not present

## 2014-06-15 DIAGNOSIS — I82409 Acute embolism and thrombosis of unspecified deep veins of unspecified lower extremity: Secondary | ICD-10-CM | POA: Diagnosis not present

## 2014-06-15 DIAGNOSIS — I6789 Other cerebrovascular disease: Secondary | ICD-10-CM | POA: Diagnosis not present

## 2014-06-17 DIAGNOSIS — R1311 Dysphagia, oral phase: Secondary | ICD-10-CM | POA: Diagnosis not present

## 2014-06-17 DIAGNOSIS — R279 Unspecified lack of coordination: Secondary | ICD-10-CM | POA: Diagnosis not present

## 2014-06-17 DIAGNOSIS — I6789 Other cerebrovascular disease: Secondary | ICD-10-CM | POA: Diagnosis not present

## 2014-06-17 DIAGNOSIS — M6281 Muscle weakness (generalized): Secondary | ICD-10-CM | POA: Diagnosis not present

## 2014-06-17 DIAGNOSIS — R1310 Dysphagia, unspecified: Secondary | ICD-10-CM | POA: Diagnosis not present

## 2014-06-17 DIAGNOSIS — I69998 Other sequelae following unspecified cerebrovascular disease: Secondary | ICD-10-CM | POA: Diagnosis not present

## 2014-06-18 DIAGNOSIS — I69998 Other sequelae following unspecified cerebrovascular disease: Secondary | ICD-10-CM | POA: Diagnosis not present

## 2014-06-18 DIAGNOSIS — R1310 Dysphagia, unspecified: Secondary | ICD-10-CM | POA: Diagnosis not present

## 2014-06-18 DIAGNOSIS — R1311 Dysphagia, oral phase: Secondary | ICD-10-CM | POA: Diagnosis not present

## 2014-06-18 DIAGNOSIS — M6281 Muscle weakness (generalized): Secondary | ICD-10-CM | POA: Diagnosis not present

## 2014-06-18 DIAGNOSIS — R279 Unspecified lack of coordination: Secondary | ICD-10-CM | POA: Diagnosis not present

## 2014-06-18 DIAGNOSIS — I6789 Other cerebrovascular disease: Secondary | ICD-10-CM | POA: Diagnosis not present

## 2014-06-19 DIAGNOSIS — I6789 Other cerebrovascular disease: Secondary | ICD-10-CM | POA: Diagnosis not present

## 2014-06-19 DIAGNOSIS — I69998 Other sequelae following unspecified cerebrovascular disease: Secondary | ICD-10-CM | POA: Diagnosis not present

## 2014-06-19 DIAGNOSIS — R1310 Dysphagia, unspecified: Secondary | ICD-10-CM | POA: Diagnosis not present

## 2014-06-19 DIAGNOSIS — R279 Unspecified lack of coordination: Secondary | ICD-10-CM | POA: Diagnosis not present

## 2014-06-19 DIAGNOSIS — R1311 Dysphagia, oral phase: Secondary | ICD-10-CM | POA: Diagnosis not present

## 2014-06-19 DIAGNOSIS — M6281 Muscle weakness (generalized): Secondary | ICD-10-CM | POA: Diagnosis not present

## 2014-06-20 DIAGNOSIS — I69998 Other sequelae following unspecified cerebrovascular disease: Secondary | ICD-10-CM | POA: Diagnosis not present

## 2014-06-20 DIAGNOSIS — R279 Unspecified lack of coordination: Secondary | ICD-10-CM | POA: Diagnosis not present

## 2014-06-20 DIAGNOSIS — M6281 Muscle weakness (generalized): Secondary | ICD-10-CM | POA: Diagnosis not present

## 2014-06-20 DIAGNOSIS — I639 Cerebral infarction, unspecified: Secondary | ICD-10-CM | POA: Diagnosis not present

## 2014-06-20 DIAGNOSIS — R1311 Dysphagia, oral phase: Secondary | ICD-10-CM | POA: Diagnosis not present

## 2014-06-20 DIAGNOSIS — I6789 Other cerebrovascular disease: Secondary | ICD-10-CM | POA: Diagnosis not present

## 2014-06-20 DIAGNOSIS — R1310 Dysphagia, unspecified: Secondary | ICD-10-CM | POA: Diagnosis not present

## 2014-06-21 DIAGNOSIS — R1310 Dysphagia, unspecified: Secondary | ICD-10-CM | POA: Diagnosis not present

## 2014-06-21 DIAGNOSIS — I69998 Other sequelae following unspecified cerebrovascular disease: Secondary | ICD-10-CM | POA: Diagnosis not present

## 2014-06-21 DIAGNOSIS — I6789 Other cerebrovascular disease: Secondary | ICD-10-CM | POA: Diagnosis not present

## 2014-06-21 DIAGNOSIS — M6281 Muscle weakness (generalized): Secondary | ICD-10-CM | POA: Diagnosis not present

## 2014-06-21 DIAGNOSIS — R1311 Dysphagia, oral phase: Secondary | ICD-10-CM | POA: Diagnosis not present

## 2014-06-21 DIAGNOSIS — R279 Unspecified lack of coordination: Secondary | ICD-10-CM | POA: Diagnosis not present

## 2014-06-22 DIAGNOSIS — I6789 Other cerebrovascular disease: Secondary | ICD-10-CM | POA: Diagnosis not present

## 2014-06-22 DIAGNOSIS — R279 Unspecified lack of coordination: Secondary | ICD-10-CM | POA: Diagnosis not present

## 2014-06-22 DIAGNOSIS — R1311 Dysphagia, oral phase: Secondary | ICD-10-CM | POA: Diagnosis not present

## 2014-06-22 DIAGNOSIS — M6281 Muscle weakness (generalized): Secondary | ICD-10-CM | POA: Diagnosis not present

## 2014-06-22 DIAGNOSIS — I69998 Other sequelae following unspecified cerebrovascular disease: Secondary | ICD-10-CM | POA: Diagnosis not present

## 2014-06-22 DIAGNOSIS — R1310 Dysphagia, unspecified: Secondary | ICD-10-CM | POA: Diagnosis not present

## 2014-06-23 DIAGNOSIS — M6281 Muscle weakness (generalized): Secondary | ICD-10-CM | POA: Diagnosis not present

## 2014-06-23 DIAGNOSIS — I6789 Other cerebrovascular disease: Secondary | ICD-10-CM | POA: Diagnosis not present

## 2014-06-23 DIAGNOSIS — R279 Unspecified lack of coordination: Secondary | ICD-10-CM | POA: Diagnosis not present

## 2014-06-23 DIAGNOSIS — R1311 Dysphagia, oral phase: Secondary | ICD-10-CM | POA: Diagnosis not present

## 2014-06-23 DIAGNOSIS — R1310 Dysphagia, unspecified: Secondary | ICD-10-CM | POA: Diagnosis not present

## 2014-06-23 DIAGNOSIS — I69998 Other sequelae following unspecified cerebrovascular disease: Secondary | ICD-10-CM | POA: Diagnosis not present

## 2014-06-24 DIAGNOSIS — M6281 Muscle weakness (generalized): Secondary | ICD-10-CM | POA: Diagnosis not present

## 2014-06-24 DIAGNOSIS — I6789 Other cerebrovascular disease: Secondary | ICD-10-CM | POA: Diagnosis not present

## 2014-06-24 DIAGNOSIS — R279 Unspecified lack of coordination: Secondary | ICD-10-CM | POA: Diagnosis not present

## 2014-06-24 DIAGNOSIS — R1310 Dysphagia, unspecified: Secondary | ICD-10-CM | POA: Diagnosis not present

## 2014-06-24 DIAGNOSIS — R1311 Dysphagia, oral phase: Secondary | ICD-10-CM | POA: Diagnosis not present

## 2014-06-24 DIAGNOSIS — I69998 Other sequelae following unspecified cerebrovascular disease: Secondary | ICD-10-CM | POA: Diagnosis not present

## 2014-06-25 DIAGNOSIS — R279 Unspecified lack of coordination: Secondary | ICD-10-CM | POA: Diagnosis not present

## 2014-06-25 DIAGNOSIS — I6789 Other cerebrovascular disease: Secondary | ICD-10-CM | POA: Diagnosis not present

## 2014-06-25 DIAGNOSIS — M6281 Muscle weakness (generalized): Secondary | ICD-10-CM | POA: Diagnosis not present

## 2014-06-25 DIAGNOSIS — R1310 Dysphagia, unspecified: Secondary | ICD-10-CM | POA: Diagnosis not present

## 2014-06-25 DIAGNOSIS — I82409 Acute embolism and thrombosis of unspecified deep veins of unspecified lower extremity: Secondary | ICD-10-CM | POA: Diagnosis not present

## 2014-06-25 DIAGNOSIS — R1311 Dysphagia, oral phase: Secondary | ICD-10-CM | POA: Diagnosis not present

## 2014-06-25 DIAGNOSIS — I69998 Other sequelae following unspecified cerebrovascular disease: Secondary | ICD-10-CM | POA: Diagnosis not present

## 2014-06-26 DIAGNOSIS — R1310 Dysphagia, unspecified: Secondary | ICD-10-CM | POA: Diagnosis not present

## 2014-06-26 DIAGNOSIS — R1311 Dysphagia, oral phase: Secondary | ICD-10-CM | POA: Diagnosis not present

## 2014-06-26 DIAGNOSIS — M6281 Muscle weakness (generalized): Secondary | ICD-10-CM | POA: Diagnosis not present

## 2014-06-26 DIAGNOSIS — R279 Unspecified lack of coordination: Secondary | ICD-10-CM | POA: Diagnosis not present

## 2014-06-26 DIAGNOSIS — I69998 Other sequelae following unspecified cerebrovascular disease: Secondary | ICD-10-CM | POA: Diagnosis not present

## 2014-06-26 DIAGNOSIS — I6789 Other cerebrovascular disease: Secondary | ICD-10-CM | POA: Diagnosis not present

## 2014-06-27 DIAGNOSIS — R1310 Dysphagia, unspecified: Secondary | ICD-10-CM | POA: Diagnosis not present

## 2014-06-27 DIAGNOSIS — R1311 Dysphagia, oral phase: Secondary | ICD-10-CM | POA: Diagnosis not present

## 2014-06-27 DIAGNOSIS — I6789 Other cerebrovascular disease: Secondary | ICD-10-CM | POA: Diagnosis not present

## 2014-06-27 DIAGNOSIS — R279 Unspecified lack of coordination: Secondary | ICD-10-CM | POA: Diagnosis not present

## 2014-06-27 DIAGNOSIS — I69998 Other sequelae following unspecified cerebrovascular disease: Secondary | ICD-10-CM | POA: Diagnosis not present

## 2014-06-27 DIAGNOSIS — M6281 Muscle weakness (generalized): Secondary | ICD-10-CM | POA: Diagnosis not present

## 2014-07-02 DIAGNOSIS — I82409 Acute embolism and thrombosis of unspecified deep veins of unspecified lower extremity: Secondary | ICD-10-CM | POA: Diagnosis not present

## 2014-07-04 DIAGNOSIS — I82409 Acute embolism and thrombosis of unspecified deep veins of unspecified lower extremity: Secondary | ICD-10-CM | POA: Diagnosis not present

## 2014-07-11 DIAGNOSIS — I82409 Acute embolism and thrombosis of unspecified deep veins of unspecified lower extremity: Secondary | ICD-10-CM | POA: Diagnosis not present

## 2014-07-18 DIAGNOSIS — I82409 Acute embolism and thrombosis of unspecified deep veins of unspecified lower extremity: Secondary | ICD-10-CM | POA: Diagnosis not present

## 2014-07-25 DIAGNOSIS — I639 Cerebral infarction, unspecified: Secondary | ICD-10-CM | POA: Diagnosis not present

## 2014-08-16 DIAGNOSIS — I6930 Unspecified sequelae of cerebral infarction: Secondary | ICD-10-CM | POA: Diagnosis not present

## 2014-08-16 DIAGNOSIS — Z7901 Long term (current) use of anticoagulants: Secondary | ICD-10-CM | POA: Diagnosis not present

## 2014-08-16 DIAGNOSIS — G08 Intracranial and intraspinal phlebitis and thrombophlebitis: Secondary | ICD-10-CM | POA: Diagnosis not present

## 2014-08-18 DIAGNOSIS — Z5181 Encounter for therapeutic drug level monitoring: Secondary | ICD-10-CM | POA: Diagnosis not present

## 2014-08-18 DIAGNOSIS — Z431 Encounter for attention to gastrostomy: Secondary | ICD-10-CM | POA: Diagnosis not present

## 2014-08-18 DIAGNOSIS — I69091 Dysphagia following nontraumatic subarachnoid hemorrhage: Secondary | ICD-10-CM | POA: Diagnosis not present

## 2014-08-18 DIAGNOSIS — I69051 Hemiplegia and hemiparesis following nontraumatic subarachnoid hemorrhage affecting right dominant side: Secondary | ICD-10-CM | POA: Diagnosis not present

## 2014-08-18 DIAGNOSIS — I6901 Cognitive deficits following nontraumatic subarachnoid hemorrhage: Secondary | ICD-10-CM | POA: Diagnosis not present

## 2014-08-18 DIAGNOSIS — R1312 Dysphagia, oropharyngeal phase: Secondary | ICD-10-CM | POA: Diagnosis not present

## 2014-08-18 DIAGNOSIS — I69020 Aphasia following nontraumatic subarachnoid hemorrhage: Secondary | ICD-10-CM | POA: Diagnosis not present

## 2014-08-18 DIAGNOSIS — Z7901 Long term (current) use of anticoagulants: Secondary | ICD-10-CM | POA: Diagnosis not present

## 2014-08-18 DIAGNOSIS — I69120 Aphasia following nontraumatic intracerebral hemorrhage: Secondary | ICD-10-CM | POA: Diagnosis not present

## 2014-08-18 DIAGNOSIS — I10 Essential (primary) hypertension: Secondary | ICD-10-CM | POA: Diagnosis not present

## 2014-08-18 DIAGNOSIS — I6001 Nontraumatic subarachnoid hemorrhage from right carotid siphon and bifurcation: Secondary | ICD-10-CM | POA: Diagnosis not present

## 2014-08-18 DIAGNOSIS — Z87440 Personal history of urinary (tract) infections: Secondary | ICD-10-CM | POA: Diagnosis not present

## 2014-08-20 DIAGNOSIS — R1312 Dysphagia, oropharyngeal phase: Secondary | ICD-10-CM | POA: Diagnosis not present

## 2014-08-20 DIAGNOSIS — I69091 Dysphagia following nontraumatic subarachnoid hemorrhage: Secondary | ICD-10-CM | POA: Diagnosis not present

## 2014-08-20 DIAGNOSIS — I69020 Aphasia following nontraumatic subarachnoid hemorrhage: Secondary | ICD-10-CM | POA: Diagnosis not present

## 2014-08-20 DIAGNOSIS — I69051 Hemiplegia and hemiparesis following nontraumatic subarachnoid hemorrhage affecting right dominant side: Secondary | ICD-10-CM | POA: Diagnosis not present

## 2014-08-20 DIAGNOSIS — I10 Essential (primary) hypertension: Secondary | ICD-10-CM | POA: Diagnosis not present

## 2014-08-20 DIAGNOSIS — I6901 Cognitive deficits following nontraumatic subarachnoid hemorrhage: Secondary | ICD-10-CM | POA: Diagnosis not present

## 2014-08-21 DIAGNOSIS — I69051 Hemiplegia and hemiparesis following nontraumatic subarachnoid hemorrhage affecting right dominant side: Secondary | ICD-10-CM | POA: Diagnosis not present

## 2014-08-21 DIAGNOSIS — I10 Essential (primary) hypertension: Secondary | ICD-10-CM | POA: Diagnosis not present

## 2014-08-21 DIAGNOSIS — I6901 Cognitive deficits following nontraumatic subarachnoid hemorrhage: Secondary | ICD-10-CM | POA: Diagnosis not present

## 2014-08-21 DIAGNOSIS — I69020 Aphasia following nontraumatic subarachnoid hemorrhage: Secondary | ICD-10-CM | POA: Diagnosis not present

## 2014-08-21 DIAGNOSIS — R1312 Dysphagia, oropharyngeal phase: Secondary | ICD-10-CM | POA: Diagnosis not present

## 2014-08-21 DIAGNOSIS — I69091 Dysphagia following nontraumatic subarachnoid hemorrhage: Secondary | ICD-10-CM | POA: Diagnosis not present

## 2014-08-22 DIAGNOSIS — I69091 Dysphagia following nontraumatic subarachnoid hemorrhage: Secondary | ICD-10-CM | POA: Diagnosis not present

## 2014-08-22 DIAGNOSIS — I6901 Cognitive deficits following nontraumatic subarachnoid hemorrhage: Secondary | ICD-10-CM | POA: Diagnosis not present

## 2014-08-22 DIAGNOSIS — I69051 Hemiplegia and hemiparesis following nontraumatic subarachnoid hemorrhage affecting right dominant side: Secondary | ICD-10-CM | POA: Diagnosis not present

## 2014-08-22 DIAGNOSIS — I69020 Aphasia following nontraumatic subarachnoid hemorrhage: Secondary | ICD-10-CM | POA: Diagnosis not present

## 2014-08-22 DIAGNOSIS — R1312 Dysphagia, oropharyngeal phase: Secondary | ICD-10-CM | POA: Diagnosis not present

## 2014-08-22 DIAGNOSIS — I10 Essential (primary) hypertension: Secondary | ICD-10-CM | POA: Diagnosis not present

## 2014-08-23 DIAGNOSIS — Z7901 Long term (current) use of anticoagulants: Secondary | ICD-10-CM | POA: Diagnosis not present

## 2014-08-23 DIAGNOSIS — I6200 Nontraumatic subdural hemorrhage, unspecified: Secondary | ICD-10-CM | POA: Diagnosis not present

## 2014-08-27 DIAGNOSIS — I69020 Aphasia following nontraumatic subarachnoid hemorrhage: Secondary | ICD-10-CM | POA: Diagnosis not present

## 2014-08-27 DIAGNOSIS — I69051 Hemiplegia and hemiparesis following nontraumatic subarachnoid hemorrhage affecting right dominant side: Secondary | ICD-10-CM | POA: Diagnosis not present

## 2014-08-27 DIAGNOSIS — R1312 Dysphagia, oropharyngeal phase: Secondary | ICD-10-CM | POA: Diagnosis not present

## 2014-08-27 DIAGNOSIS — I6901 Cognitive deficits following nontraumatic subarachnoid hemorrhage: Secondary | ICD-10-CM | POA: Diagnosis not present

## 2014-08-27 DIAGNOSIS — I10 Essential (primary) hypertension: Secondary | ICD-10-CM | POA: Diagnosis not present

## 2014-08-27 DIAGNOSIS — I69091 Dysphagia following nontraumatic subarachnoid hemorrhage: Secondary | ICD-10-CM | POA: Diagnosis not present

## 2014-08-28 DIAGNOSIS — I6901 Cognitive deficits following nontraumatic subarachnoid hemorrhage: Secondary | ICD-10-CM | POA: Diagnosis not present

## 2014-08-28 DIAGNOSIS — I69051 Hemiplegia and hemiparesis following nontraumatic subarachnoid hemorrhage affecting right dominant side: Secondary | ICD-10-CM | POA: Diagnosis not present

## 2014-08-28 DIAGNOSIS — I69091 Dysphagia following nontraumatic subarachnoid hemorrhage: Secondary | ICD-10-CM | POA: Diagnosis not present

## 2014-08-28 DIAGNOSIS — I10 Essential (primary) hypertension: Secondary | ICD-10-CM | POA: Diagnosis not present

## 2014-08-28 DIAGNOSIS — R1312 Dysphagia, oropharyngeal phase: Secondary | ICD-10-CM | POA: Diagnosis not present

## 2014-08-28 DIAGNOSIS — I69020 Aphasia following nontraumatic subarachnoid hemorrhage: Secondary | ICD-10-CM | POA: Diagnosis not present

## 2014-08-29 DIAGNOSIS — I69020 Aphasia following nontraumatic subarachnoid hemorrhage: Secondary | ICD-10-CM | POA: Diagnosis not present

## 2014-08-29 DIAGNOSIS — I69091 Dysphagia following nontraumatic subarachnoid hemorrhage: Secondary | ICD-10-CM | POA: Diagnosis not present

## 2014-08-29 DIAGNOSIS — R1312 Dysphagia, oropharyngeal phase: Secondary | ICD-10-CM | POA: Diagnosis not present

## 2014-08-29 DIAGNOSIS — I6901 Cognitive deficits following nontraumatic subarachnoid hemorrhage: Secondary | ICD-10-CM | POA: Diagnosis not present

## 2014-08-29 DIAGNOSIS — I69051 Hemiplegia and hemiparesis following nontraumatic subarachnoid hemorrhage affecting right dominant side: Secondary | ICD-10-CM | POA: Diagnosis not present

## 2014-08-29 DIAGNOSIS — I10 Essential (primary) hypertension: Secondary | ICD-10-CM | POA: Diagnosis not present

## 2014-08-30 DIAGNOSIS — I6200 Nontraumatic subdural hemorrhage, unspecified: Secondary | ICD-10-CM | POA: Diagnosis not present

## 2014-08-30 DIAGNOSIS — I69020 Aphasia following nontraumatic subarachnoid hemorrhage: Secondary | ICD-10-CM | POA: Diagnosis not present

## 2014-08-30 DIAGNOSIS — I6901 Cognitive deficits following nontraumatic subarachnoid hemorrhage: Secondary | ICD-10-CM | POA: Diagnosis not present

## 2014-08-30 DIAGNOSIS — I69091 Dysphagia following nontraumatic subarachnoid hemorrhage: Secondary | ICD-10-CM | POA: Diagnosis not present

## 2014-08-30 DIAGNOSIS — I10 Essential (primary) hypertension: Secondary | ICD-10-CM | POA: Diagnosis not present

## 2014-08-30 DIAGNOSIS — I69051 Hemiplegia and hemiparesis following nontraumatic subarachnoid hemorrhage affecting right dominant side: Secondary | ICD-10-CM | POA: Diagnosis not present

## 2014-08-30 DIAGNOSIS — Z7901 Long term (current) use of anticoagulants: Secondary | ICD-10-CM | POA: Diagnosis not present

## 2014-08-30 DIAGNOSIS — R1312 Dysphagia, oropharyngeal phase: Secondary | ICD-10-CM | POA: Diagnosis not present

## 2014-08-31 DIAGNOSIS — R1312 Dysphagia, oropharyngeal phase: Secondary | ICD-10-CM | POA: Diagnosis not present

## 2014-08-31 DIAGNOSIS — I6901 Cognitive deficits following nontraumatic subarachnoid hemorrhage: Secondary | ICD-10-CM | POA: Diagnosis not present

## 2014-08-31 DIAGNOSIS — I69020 Aphasia following nontraumatic subarachnoid hemorrhage: Secondary | ICD-10-CM | POA: Diagnosis not present

## 2014-08-31 DIAGNOSIS — I69091 Dysphagia following nontraumatic subarachnoid hemorrhage: Secondary | ICD-10-CM | POA: Diagnosis not present

## 2014-08-31 DIAGNOSIS — I69051 Hemiplegia and hemiparesis following nontraumatic subarachnoid hemorrhage affecting right dominant side: Secondary | ICD-10-CM | POA: Diagnosis not present

## 2014-08-31 DIAGNOSIS — I10 Essential (primary) hypertension: Secondary | ICD-10-CM | POA: Diagnosis not present

## 2014-09-03 DIAGNOSIS — I69091 Dysphagia following nontraumatic subarachnoid hemorrhage: Secondary | ICD-10-CM | POA: Diagnosis not present

## 2014-09-03 DIAGNOSIS — I69051 Hemiplegia and hemiparesis following nontraumatic subarachnoid hemorrhage affecting right dominant side: Secondary | ICD-10-CM | POA: Diagnosis not present

## 2014-09-03 DIAGNOSIS — Z7901 Long term (current) use of anticoagulants: Secondary | ICD-10-CM | POA: Diagnosis not present

## 2014-09-03 DIAGNOSIS — I69020 Aphasia following nontraumatic subarachnoid hemorrhage: Secondary | ICD-10-CM | POA: Diagnosis not present

## 2014-09-03 DIAGNOSIS — I6901 Cognitive deficits following nontraumatic subarachnoid hemorrhage: Secondary | ICD-10-CM | POA: Diagnosis not present

## 2014-09-03 DIAGNOSIS — I6200 Nontraumatic subdural hemorrhage, unspecified: Secondary | ICD-10-CM | POA: Diagnosis not present

## 2014-09-03 DIAGNOSIS — I10 Essential (primary) hypertension: Secondary | ICD-10-CM | POA: Diagnosis not present

## 2014-09-03 DIAGNOSIS — R1312 Dysphagia, oropharyngeal phase: Secondary | ICD-10-CM | POA: Diagnosis not present

## 2014-09-04 DIAGNOSIS — R1312 Dysphagia, oropharyngeal phase: Secondary | ICD-10-CM | POA: Diagnosis not present

## 2014-09-04 DIAGNOSIS — I10 Essential (primary) hypertension: Secondary | ICD-10-CM | POA: Diagnosis not present

## 2014-09-04 DIAGNOSIS — I6901 Cognitive deficits following nontraumatic subarachnoid hemorrhage: Secondary | ICD-10-CM | POA: Diagnosis not present

## 2014-09-04 DIAGNOSIS — I69051 Hemiplegia and hemiparesis following nontraumatic subarachnoid hemorrhage affecting right dominant side: Secondary | ICD-10-CM | POA: Diagnosis not present

## 2014-09-04 DIAGNOSIS — I69091 Dysphagia following nontraumatic subarachnoid hemorrhage: Secondary | ICD-10-CM | POA: Diagnosis not present

## 2014-09-04 DIAGNOSIS — I69020 Aphasia following nontraumatic subarachnoid hemorrhage: Secondary | ICD-10-CM | POA: Diagnosis not present

## 2014-09-05 DIAGNOSIS — I69091 Dysphagia following nontraumatic subarachnoid hemorrhage: Secondary | ICD-10-CM | POA: Diagnosis not present

## 2014-09-05 DIAGNOSIS — I69020 Aphasia following nontraumatic subarachnoid hemorrhage: Secondary | ICD-10-CM | POA: Diagnosis not present

## 2014-09-05 DIAGNOSIS — R1312 Dysphagia, oropharyngeal phase: Secondary | ICD-10-CM | POA: Diagnosis not present

## 2014-09-05 DIAGNOSIS — I69051 Hemiplegia and hemiparesis following nontraumatic subarachnoid hemorrhage affecting right dominant side: Secondary | ICD-10-CM | POA: Diagnosis not present

## 2014-09-05 DIAGNOSIS — I10 Essential (primary) hypertension: Secondary | ICD-10-CM | POA: Diagnosis not present

## 2014-09-05 DIAGNOSIS — I6901 Cognitive deficits following nontraumatic subarachnoid hemorrhage: Secondary | ICD-10-CM | POA: Diagnosis not present

## 2014-09-06 DIAGNOSIS — I69091 Dysphagia following nontraumatic subarachnoid hemorrhage: Secondary | ICD-10-CM | POA: Diagnosis not present

## 2014-09-06 DIAGNOSIS — R1312 Dysphagia, oropharyngeal phase: Secondary | ICD-10-CM | POA: Diagnosis not present

## 2014-09-06 DIAGNOSIS — I10 Essential (primary) hypertension: Secondary | ICD-10-CM | POA: Diagnosis not present

## 2014-09-06 DIAGNOSIS — I69051 Hemiplegia and hemiparesis following nontraumatic subarachnoid hemorrhage affecting right dominant side: Secondary | ICD-10-CM | POA: Diagnosis not present

## 2014-09-06 DIAGNOSIS — I69020 Aphasia following nontraumatic subarachnoid hemorrhage: Secondary | ICD-10-CM | POA: Diagnosis not present

## 2014-09-06 DIAGNOSIS — I6901 Cognitive deficits following nontraumatic subarachnoid hemorrhage: Secondary | ICD-10-CM | POA: Diagnosis not present

## 2014-09-07 DIAGNOSIS — Z7901 Long term (current) use of anticoagulants: Secondary | ICD-10-CM | POA: Diagnosis not present

## 2014-09-07 DIAGNOSIS — I10 Essential (primary) hypertension: Secondary | ICD-10-CM | POA: Diagnosis not present

## 2014-09-07 DIAGNOSIS — I69051 Hemiplegia and hemiparesis following nontraumatic subarachnoid hemorrhage affecting right dominant side: Secondary | ICD-10-CM | POA: Diagnosis not present

## 2014-09-07 DIAGNOSIS — I69091 Dysphagia following nontraumatic subarachnoid hemorrhage: Secondary | ICD-10-CM | POA: Diagnosis not present

## 2014-09-07 DIAGNOSIS — I6901 Cognitive deficits following nontraumatic subarachnoid hemorrhage: Secondary | ICD-10-CM | POA: Diagnosis not present

## 2014-09-07 DIAGNOSIS — I6200 Nontraumatic subdural hemorrhage, unspecified: Secondary | ICD-10-CM | POA: Diagnosis not present

## 2014-09-07 DIAGNOSIS — R1312 Dysphagia, oropharyngeal phase: Secondary | ICD-10-CM | POA: Diagnosis not present

## 2014-09-07 DIAGNOSIS — I69020 Aphasia following nontraumatic subarachnoid hemorrhage: Secondary | ICD-10-CM | POA: Diagnosis not present

## 2014-09-11 DIAGNOSIS — I69051 Hemiplegia and hemiparesis following nontraumatic subarachnoid hemorrhage affecting right dominant side: Secondary | ICD-10-CM | POA: Diagnosis not present

## 2014-09-11 DIAGNOSIS — I6901 Cognitive deficits following nontraumatic subarachnoid hemorrhage: Secondary | ICD-10-CM | POA: Diagnosis not present

## 2014-09-11 DIAGNOSIS — R1312 Dysphagia, oropharyngeal phase: Secondary | ICD-10-CM | POA: Diagnosis not present

## 2014-09-11 DIAGNOSIS — I10 Essential (primary) hypertension: Secondary | ICD-10-CM | POA: Diagnosis not present

## 2014-09-11 DIAGNOSIS — I69020 Aphasia following nontraumatic subarachnoid hemorrhage: Secondary | ICD-10-CM | POA: Diagnosis not present

## 2014-09-11 DIAGNOSIS — I69091 Dysphagia following nontraumatic subarachnoid hemorrhage: Secondary | ICD-10-CM | POA: Diagnosis not present

## 2014-09-12 DIAGNOSIS — I6901 Cognitive deficits following nontraumatic subarachnoid hemorrhage: Secondary | ICD-10-CM | POA: Diagnosis not present

## 2014-09-12 DIAGNOSIS — I69020 Aphasia following nontraumatic subarachnoid hemorrhage: Secondary | ICD-10-CM | POA: Diagnosis not present

## 2014-09-12 DIAGNOSIS — R1312 Dysphagia, oropharyngeal phase: Secondary | ICD-10-CM | POA: Diagnosis not present

## 2014-09-12 DIAGNOSIS — I10 Essential (primary) hypertension: Secondary | ICD-10-CM | POA: Diagnosis not present

## 2014-09-12 DIAGNOSIS — I69051 Hemiplegia and hemiparesis following nontraumatic subarachnoid hemorrhage affecting right dominant side: Secondary | ICD-10-CM | POA: Diagnosis not present

## 2014-09-12 DIAGNOSIS — I69091 Dysphagia following nontraumatic subarachnoid hemorrhage: Secondary | ICD-10-CM | POA: Diagnosis not present

## 2014-09-13 DIAGNOSIS — I69051 Hemiplegia and hemiparesis following nontraumatic subarachnoid hemorrhage affecting right dominant side: Secondary | ICD-10-CM | POA: Diagnosis not present

## 2014-09-13 DIAGNOSIS — I6901 Cognitive deficits following nontraumatic subarachnoid hemorrhage: Secondary | ICD-10-CM | POA: Diagnosis not present

## 2014-09-13 DIAGNOSIS — Z7901 Long term (current) use of anticoagulants: Secondary | ICD-10-CM | POA: Diagnosis not present

## 2014-09-13 DIAGNOSIS — I10 Essential (primary) hypertension: Secondary | ICD-10-CM | POA: Diagnosis not present

## 2014-09-13 DIAGNOSIS — I69020 Aphasia following nontraumatic subarachnoid hemorrhage: Secondary | ICD-10-CM | POA: Diagnosis not present

## 2014-09-13 DIAGNOSIS — I6200 Nontraumatic subdural hemorrhage, unspecified: Secondary | ICD-10-CM | POA: Diagnosis not present

## 2014-09-13 DIAGNOSIS — R1312 Dysphagia, oropharyngeal phase: Secondary | ICD-10-CM | POA: Diagnosis not present

## 2014-09-13 DIAGNOSIS — I69091 Dysphagia following nontraumatic subarachnoid hemorrhage: Secondary | ICD-10-CM | POA: Diagnosis not present

## 2014-09-14 DIAGNOSIS — I69020 Aphasia following nontraumatic subarachnoid hemorrhage: Secondary | ICD-10-CM | POA: Diagnosis not present

## 2014-09-14 DIAGNOSIS — I10 Essential (primary) hypertension: Secondary | ICD-10-CM | POA: Diagnosis not present

## 2014-09-14 DIAGNOSIS — I69091 Dysphagia following nontraumatic subarachnoid hemorrhage: Secondary | ICD-10-CM | POA: Diagnosis not present

## 2014-09-14 DIAGNOSIS — I6901 Cognitive deficits following nontraumatic subarachnoid hemorrhage: Secondary | ICD-10-CM | POA: Diagnosis not present

## 2014-09-14 DIAGNOSIS — R1312 Dysphagia, oropharyngeal phase: Secondary | ICD-10-CM | POA: Diagnosis not present

## 2014-09-14 DIAGNOSIS — I69051 Hemiplegia and hemiparesis following nontraumatic subarachnoid hemorrhage affecting right dominant side: Secondary | ICD-10-CM | POA: Diagnosis not present

## 2014-09-17 DIAGNOSIS — I69020 Aphasia following nontraumatic subarachnoid hemorrhage: Secondary | ICD-10-CM | POA: Diagnosis not present

## 2014-09-17 DIAGNOSIS — I10 Essential (primary) hypertension: Secondary | ICD-10-CM | POA: Diagnosis not present

## 2014-09-17 DIAGNOSIS — I69051 Hemiplegia and hemiparesis following nontraumatic subarachnoid hemorrhage affecting right dominant side: Secondary | ICD-10-CM | POA: Diagnosis not present

## 2014-09-17 DIAGNOSIS — Z7901 Long term (current) use of anticoagulants: Secondary | ICD-10-CM | POA: Diagnosis not present

## 2014-09-17 DIAGNOSIS — R1312 Dysphagia, oropharyngeal phase: Secondary | ICD-10-CM | POA: Diagnosis not present

## 2014-09-17 DIAGNOSIS — I6901 Cognitive deficits following nontraumatic subarachnoid hemorrhage: Secondary | ICD-10-CM | POA: Diagnosis not present

## 2014-09-17 DIAGNOSIS — I69091 Dysphagia following nontraumatic subarachnoid hemorrhage: Secondary | ICD-10-CM | POA: Diagnosis not present

## 2014-09-18 DIAGNOSIS — R1312 Dysphagia, oropharyngeal phase: Secondary | ICD-10-CM | POA: Diagnosis not present

## 2014-09-18 DIAGNOSIS — I69091 Dysphagia following nontraumatic subarachnoid hemorrhage: Secondary | ICD-10-CM | POA: Diagnosis not present

## 2014-09-18 DIAGNOSIS — I6901 Cognitive deficits following nontraumatic subarachnoid hemorrhage: Secondary | ICD-10-CM | POA: Diagnosis not present

## 2014-09-18 DIAGNOSIS — I69051 Hemiplegia and hemiparesis following nontraumatic subarachnoid hemorrhage affecting right dominant side: Secondary | ICD-10-CM | POA: Diagnosis not present

## 2014-09-18 DIAGNOSIS — I10 Essential (primary) hypertension: Secondary | ICD-10-CM | POA: Diagnosis not present

## 2014-09-18 DIAGNOSIS — I69020 Aphasia following nontraumatic subarachnoid hemorrhage: Secondary | ICD-10-CM | POA: Diagnosis not present

## 2014-09-19 DIAGNOSIS — I69020 Aphasia following nontraumatic subarachnoid hemorrhage: Secondary | ICD-10-CM | POA: Diagnosis not present

## 2014-09-19 DIAGNOSIS — I69091 Dysphagia following nontraumatic subarachnoid hemorrhage: Secondary | ICD-10-CM | POA: Diagnosis not present

## 2014-09-19 DIAGNOSIS — I10 Essential (primary) hypertension: Secondary | ICD-10-CM | POA: Diagnosis not present

## 2014-09-19 DIAGNOSIS — I6901 Cognitive deficits following nontraumatic subarachnoid hemorrhage: Secondary | ICD-10-CM | POA: Diagnosis not present

## 2014-09-19 DIAGNOSIS — R1312 Dysphagia, oropharyngeal phase: Secondary | ICD-10-CM | POA: Diagnosis not present

## 2014-09-19 DIAGNOSIS — I69051 Hemiplegia and hemiparesis following nontraumatic subarachnoid hemorrhage affecting right dominant side: Secondary | ICD-10-CM | POA: Diagnosis not present

## 2014-09-20 DIAGNOSIS — I6901 Cognitive deficits following nontraumatic subarachnoid hemorrhage: Secondary | ICD-10-CM | POA: Diagnosis not present

## 2014-09-20 DIAGNOSIS — I69020 Aphasia following nontraumatic subarachnoid hemorrhage: Secondary | ICD-10-CM | POA: Diagnosis not present

## 2014-09-20 DIAGNOSIS — I10 Essential (primary) hypertension: Secondary | ICD-10-CM | POA: Diagnosis not present

## 2014-09-20 DIAGNOSIS — R1312 Dysphagia, oropharyngeal phase: Secondary | ICD-10-CM | POA: Diagnosis not present

## 2014-09-20 DIAGNOSIS — I69091 Dysphagia following nontraumatic subarachnoid hemorrhage: Secondary | ICD-10-CM | POA: Diagnosis not present

## 2014-09-20 DIAGNOSIS — I69051 Hemiplegia and hemiparesis following nontraumatic subarachnoid hemorrhage affecting right dominant side: Secondary | ICD-10-CM | POA: Diagnosis not present

## 2014-09-21 DIAGNOSIS — I69020 Aphasia following nontraumatic subarachnoid hemorrhage: Secondary | ICD-10-CM | POA: Diagnosis not present

## 2014-09-21 DIAGNOSIS — R1312 Dysphagia, oropharyngeal phase: Secondary | ICD-10-CM | POA: Diagnosis not present

## 2014-09-21 DIAGNOSIS — I69091 Dysphagia following nontraumatic subarachnoid hemorrhage: Secondary | ICD-10-CM | POA: Diagnosis not present

## 2014-09-21 DIAGNOSIS — I10 Essential (primary) hypertension: Secondary | ICD-10-CM | POA: Diagnosis not present

## 2014-09-21 DIAGNOSIS — I6901 Cognitive deficits following nontraumatic subarachnoid hemorrhage: Secondary | ICD-10-CM | POA: Diagnosis not present

## 2014-09-21 DIAGNOSIS — I69051 Hemiplegia and hemiparesis following nontraumatic subarachnoid hemorrhage affecting right dominant side: Secondary | ICD-10-CM | POA: Diagnosis not present

## 2014-09-24 DIAGNOSIS — I69020 Aphasia following nontraumatic subarachnoid hemorrhage: Secondary | ICD-10-CM | POA: Diagnosis not present

## 2014-09-24 DIAGNOSIS — I69051 Hemiplegia and hemiparesis following nontraumatic subarachnoid hemorrhage affecting right dominant side: Secondary | ICD-10-CM | POA: Diagnosis not present

## 2014-09-24 DIAGNOSIS — I10 Essential (primary) hypertension: Secondary | ICD-10-CM | POA: Diagnosis not present

## 2014-09-24 DIAGNOSIS — I69091 Dysphagia following nontraumatic subarachnoid hemorrhage: Secondary | ICD-10-CM | POA: Diagnosis not present

## 2014-09-24 DIAGNOSIS — R1312 Dysphagia, oropharyngeal phase: Secondary | ICD-10-CM | POA: Diagnosis not present

## 2014-09-24 DIAGNOSIS — I6901 Cognitive deficits following nontraumatic subarachnoid hemorrhage: Secondary | ICD-10-CM | POA: Diagnosis not present

## 2014-09-24 DIAGNOSIS — Z7901 Long term (current) use of anticoagulants: Secondary | ICD-10-CM | POA: Diagnosis not present

## 2014-09-25 DIAGNOSIS — I6901 Cognitive deficits following nontraumatic subarachnoid hemorrhage: Secondary | ICD-10-CM | POA: Diagnosis not present

## 2014-09-25 DIAGNOSIS — I10 Essential (primary) hypertension: Secondary | ICD-10-CM | POA: Diagnosis not present

## 2014-09-25 DIAGNOSIS — I69051 Hemiplegia and hemiparesis following nontraumatic subarachnoid hemorrhage affecting right dominant side: Secondary | ICD-10-CM | POA: Diagnosis not present

## 2014-09-25 DIAGNOSIS — R1312 Dysphagia, oropharyngeal phase: Secondary | ICD-10-CM | POA: Diagnosis not present

## 2014-09-25 DIAGNOSIS — I69020 Aphasia following nontraumatic subarachnoid hemorrhage: Secondary | ICD-10-CM | POA: Diagnosis not present

## 2014-09-25 DIAGNOSIS — I69091 Dysphagia following nontraumatic subarachnoid hemorrhage: Secondary | ICD-10-CM | POA: Diagnosis not present

## 2014-09-26 DIAGNOSIS — I69091 Dysphagia following nontraumatic subarachnoid hemorrhage: Secondary | ICD-10-CM | POA: Diagnosis not present

## 2014-09-26 DIAGNOSIS — I6901 Cognitive deficits following nontraumatic subarachnoid hemorrhage: Secondary | ICD-10-CM | POA: Diagnosis not present

## 2014-09-26 DIAGNOSIS — I69051 Hemiplegia and hemiparesis following nontraumatic subarachnoid hemorrhage affecting right dominant side: Secondary | ICD-10-CM | POA: Diagnosis not present

## 2014-09-26 DIAGNOSIS — I10 Essential (primary) hypertension: Secondary | ICD-10-CM | POA: Diagnosis not present

## 2014-09-26 DIAGNOSIS — R1312 Dysphagia, oropharyngeal phase: Secondary | ICD-10-CM | POA: Diagnosis not present

## 2014-09-26 DIAGNOSIS — I69020 Aphasia following nontraumatic subarachnoid hemorrhage: Secondary | ICD-10-CM | POA: Diagnosis not present

## 2014-09-27 DIAGNOSIS — I6901 Cognitive deficits following nontraumatic subarachnoid hemorrhage: Secondary | ICD-10-CM | POA: Diagnosis not present

## 2014-09-27 DIAGNOSIS — I69020 Aphasia following nontraumatic subarachnoid hemorrhage: Secondary | ICD-10-CM | POA: Diagnosis not present

## 2014-09-27 DIAGNOSIS — I10 Essential (primary) hypertension: Secondary | ICD-10-CM | POA: Diagnosis not present

## 2014-09-27 DIAGNOSIS — R1312 Dysphagia, oropharyngeal phase: Secondary | ICD-10-CM | POA: Diagnosis not present

## 2014-09-27 DIAGNOSIS — I69091 Dysphagia following nontraumatic subarachnoid hemorrhage: Secondary | ICD-10-CM | POA: Diagnosis not present

## 2014-09-27 DIAGNOSIS — I69051 Hemiplegia and hemiparesis following nontraumatic subarachnoid hemorrhage affecting right dominant side: Secondary | ICD-10-CM | POA: Diagnosis not present

## 2014-09-28 DIAGNOSIS — I10 Essential (primary) hypertension: Secondary | ICD-10-CM | POA: Diagnosis not present

## 2014-09-28 DIAGNOSIS — I69051 Hemiplegia and hemiparesis following nontraumatic subarachnoid hemorrhage affecting right dominant side: Secondary | ICD-10-CM | POA: Diagnosis not present

## 2014-09-28 DIAGNOSIS — I6901 Cognitive deficits following nontraumatic subarachnoid hemorrhage: Secondary | ICD-10-CM | POA: Diagnosis not present

## 2014-09-28 DIAGNOSIS — R1312 Dysphagia, oropharyngeal phase: Secondary | ICD-10-CM | POA: Diagnosis not present

## 2014-09-28 DIAGNOSIS — I69091 Dysphagia following nontraumatic subarachnoid hemorrhage: Secondary | ICD-10-CM | POA: Diagnosis not present

## 2014-09-28 DIAGNOSIS — I69020 Aphasia following nontraumatic subarachnoid hemorrhage: Secondary | ICD-10-CM | POA: Diagnosis not present

## 2014-10-01 DIAGNOSIS — I69020 Aphasia following nontraumatic subarachnoid hemorrhage: Secondary | ICD-10-CM | POA: Diagnosis not present

## 2014-10-01 DIAGNOSIS — R1312 Dysphagia, oropharyngeal phase: Secondary | ICD-10-CM | POA: Diagnosis not present

## 2014-10-01 DIAGNOSIS — I69091 Dysphagia following nontraumatic subarachnoid hemorrhage: Secondary | ICD-10-CM | POA: Diagnosis not present

## 2014-10-01 DIAGNOSIS — I10 Essential (primary) hypertension: Secondary | ICD-10-CM | POA: Diagnosis not present

## 2014-10-01 DIAGNOSIS — I6901 Cognitive deficits following nontraumatic subarachnoid hemorrhage: Secondary | ICD-10-CM | POA: Diagnosis not present

## 2014-10-01 DIAGNOSIS — I69051 Hemiplegia and hemiparesis following nontraumatic subarachnoid hemorrhage affecting right dominant side: Secondary | ICD-10-CM | POA: Diagnosis not present

## 2014-10-02 DIAGNOSIS — I69020 Aphasia following nontraumatic subarachnoid hemorrhage: Secondary | ICD-10-CM | POA: Diagnosis not present

## 2014-10-02 DIAGNOSIS — I69091 Dysphagia following nontraumatic subarachnoid hemorrhage: Secondary | ICD-10-CM | POA: Diagnosis not present

## 2014-10-02 DIAGNOSIS — R1312 Dysphagia, oropharyngeal phase: Secondary | ICD-10-CM | POA: Diagnosis not present

## 2014-10-02 DIAGNOSIS — I69051 Hemiplegia and hemiparesis following nontraumatic subarachnoid hemorrhage affecting right dominant side: Secondary | ICD-10-CM | POA: Diagnosis not present

## 2014-10-02 DIAGNOSIS — I6901 Cognitive deficits following nontraumatic subarachnoid hemorrhage: Secondary | ICD-10-CM | POA: Diagnosis not present

## 2014-10-02 DIAGNOSIS — I10 Essential (primary) hypertension: Secondary | ICD-10-CM | POA: Diagnosis not present

## 2014-10-03 DIAGNOSIS — I10 Essential (primary) hypertension: Secondary | ICD-10-CM | POA: Diagnosis not present

## 2014-10-03 DIAGNOSIS — I69020 Aphasia following nontraumatic subarachnoid hemorrhage: Secondary | ICD-10-CM | POA: Diagnosis not present

## 2014-10-03 DIAGNOSIS — I69051 Hemiplegia and hemiparesis following nontraumatic subarachnoid hemorrhage affecting right dominant side: Secondary | ICD-10-CM | POA: Diagnosis not present

## 2014-10-03 DIAGNOSIS — I6901 Cognitive deficits following nontraumatic subarachnoid hemorrhage: Secondary | ICD-10-CM | POA: Diagnosis not present

## 2014-10-03 DIAGNOSIS — I69091 Dysphagia following nontraumatic subarachnoid hemorrhage: Secondary | ICD-10-CM | POA: Diagnosis not present

## 2014-10-03 DIAGNOSIS — R1312 Dysphagia, oropharyngeal phase: Secondary | ICD-10-CM | POA: Diagnosis not present

## 2014-10-04 DIAGNOSIS — I69091 Dysphagia following nontraumatic subarachnoid hemorrhage: Secondary | ICD-10-CM | POA: Diagnosis not present

## 2014-10-04 DIAGNOSIS — I10 Essential (primary) hypertension: Secondary | ICD-10-CM | POA: Diagnosis not present

## 2014-10-04 DIAGNOSIS — R1312 Dysphagia, oropharyngeal phase: Secondary | ICD-10-CM | POA: Diagnosis not present

## 2014-10-04 DIAGNOSIS — I69020 Aphasia following nontraumatic subarachnoid hemorrhage: Secondary | ICD-10-CM | POA: Diagnosis not present

## 2014-10-04 DIAGNOSIS — I6901 Cognitive deficits following nontraumatic subarachnoid hemorrhage: Secondary | ICD-10-CM | POA: Diagnosis not present

## 2014-10-04 DIAGNOSIS — I69051 Hemiplegia and hemiparesis following nontraumatic subarachnoid hemorrhage affecting right dominant side: Secondary | ICD-10-CM | POA: Diagnosis not present

## 2014-10-05 DIAGNOSIS — I6901 Cognitive deficits following nontraumatic subarachnoid hemorrhage: Secondary | ICD-10-CM | POA: Diagnosis not present

## 2014-10-05 DIAGNOSIS — I69051 Hemiplegia and hemiparesis following nontraumatic subarachnoid hemorrhage affecting right dominant side: Secondary | ICD-10-CM | POA: Diagnosis not present

## 2014-10-05 DIAGNOSIS — I10 Essential (primary) hypertension: Secondary | ICD-10-CM | POA: Diagnosis not present

## 2014-10-05 DIAGNOSIS — I69020 Aphasia following nontraumatic subarachnoid hemorrhage: Secondary | ICD-10-CM | POA: Diagnosis not present

## 2014-10-05 DIAGNOSIS — I69091 Dysphagia following nontraumatic subarachnoid hemorrhage: Secondary | ICD-10-CM | POA: Diagnosis not present

## 2014-10-05 DIAGNOSIS — R1312 Dysphagia, oropharyngeal phase: Secondary | ICD-10-CM | POA: Diagnosis not present

## 2014-10-09 DIAGNOSIS — I69051 Hemiplegia and hemiparesis following nontraumatic subarachnoid hemorrhage affecting right dominant side: Secondary | ICD-10-CM | POA: Diagnosis not present

## 2014-10-09 DIAGNOSIS — I69091 Dysphagia following nontraumatic subarachnoid hemorrhage: Secondary | ICD-10-CM | POA: Diagnosis not present

## 2014-10-09 DIAGNOSIS — I69020 Aphasia following nontraumatic subarachnoid hemorrhage: Secondary | ICD-10-CM | POA: Diagnosis not present

## 2014-10-09 DIAGNOSIS — I10 Essential (primary) hypertension: Secondary | ICD-10-CM | POA: Diagnosis not present

## 2014-10-09 DIAGNOSIS — I6901 Cognitive deficits following nontraumatic subarachnoid hemorrhage: Secondary | ICD-10-CM | POA: Diagnosis not present

## 2014-10-09 DIAGNOSIS — R1312 Dysphagia, oropharyngeal phase: Secondary | ICD-10-CM | POA: Diagnosis not present

## 2014-10-10 DIAGNOSIS — R1312 Dysphagia, oropharyngeal phase: Secondary | ICD-10-CM | POA: Diagnosis not present

## 2014-10-10 DIAGNOSIS — I69091 Dysphagia following nontraumatic subarachnoid hemorrhage: Secondary | ICD-10-CM | POA: Diagnosis not present

## 2014-10-10 DIAGNOSIS — I69051 Hemiplegia and hemiparesis following nontraumatic subarachnoid hemorrhage affecting right dominant side: Secondary | ICD-10-CM | POA: Diagnosis not present

## 2014-10-10 DIAGNOSIS — I6901 Cognitive deficits following nontraumatic subarachnoid hemorrhage: Secondary | ICD-10-CM | POA: Diagnosis not present

## 2014-10-10 DIAGNOSIS — I10 Essential (primary) hypertension: Secondary | ICD-10-CM | POA: Diagnosis not present

## 2014-10-10 DIAGNOSIS — I69020 Aphasia following nontraumatic subarachnoid hemorrhage: Secondary | ICD-10-CM | POA: Diagnosis not present

## 2014-10-12 DIAGNOSIS — I6901 Cognitive deficits following nontraumatic subarachnoid hemorrhage: Secondary | ICD-10-CM | POA: Diagnosis not present

## 2014-10-12 DIAGNOSIS — I10 Essential (primary) hypertension: Secondary | ICD-10-CM | POA: Diagnosis not present

## 2014-10-12 DIAGNOSIS — R1312 Dysphagia, oropharyngeal phase: Secondary | ICD-10-CM | POA: Diagnosis not present

## 2014-10-12 DIAGNOSIS — I69020 Aphasia following nontraumatic subarachnoid hemorrhage: Secondary | ICD-10-CM | POA: Diagnosis not present

## 2014-10-12 DIAGNOSIS — I69051 Hemiplegia and hemiparesis following nontraumatic subarachnoid hemorrhage affecting right dominant side: Secondary | ICD-10-CM | POA: Diagnosis not present

## 2014-10-12 DIAGNOSIS — I69091 Dysphagia following nontraumatic subarachnoid hemorrhage: Secondary | ICD-10-CM | POA: Diagnosis not present

## 2014-10-15 DIAGNOSIS — I69051 Hemiplegia and hemiparesis following nontraumatic subarachnoid hemorrhage affecting right dominant side: Secondary | ICD-10-CM | POA: Diagnosis not present

## 2014-10-15 DIAGNOSIS — R1312 Dysphagia, oropharyngeal phase: Secondary | ICD-10-CM | POA: Diagnosis not present

## 2014-10-15 DIAGNOSIS — I10 Essential (primary) hypertension: Secondary | ICD-10-CM | POA: Diagnosis not present

## 2014-10-15 DIAGNOSIS — I69091 Dysphagia following nontraumatic subarachnoid hemorrhage: Secondary | ICD-10-CM | POA: Diagnosis not present

## 2014-10-15 DIAGNOSIS — I6901 Cognitive deficits following nontraumatic subarachnoid hemorrhage: Secondary | ICD-10-CM | POA: Diagnosis not present

## 2014-10-15 DIAGNOSIS — I69020 Aphasia following nontraumatic subarachnoid hemorrhage: Secondary | ICD-10-CM | POA: Diagnosis not present

## 2014-10-22 DIAGNOSIS — Z7901 Long term (current) use of anticoagulants: Secondary | ICD-10-CM | POA: Diagnosis not present

## 2014-11-14 DIAGNOSIS — I6990 Unspecified sequelae of unspecified cerebrovascular disease: Secondary | ICD-10-CM | POA: Diagnosis not present

## 2014-11-14 DIAGNOSIS — I69051 Hemiplegia and hemiparesis following nontraumatic subarachnoid hemorrhage affecting right dominant side: Secondary | ICD-10-CM | POA: Diagnosis not present

## 2014-11-14 DIAGNOSIS — I639 Cerebral infarction, unspecified: Secondary | ICD-10-CM | POA: Diagnosis not present

## 2014-11-14 DIAGNOSIS — M24542 Contracture, left hand: Secondary | ICD-10-CM | POA: Diagnosis not present

## 2014-11-14 DIAGNOSIS — G4700 Insomnia, unspecified: Secondary | ICD-10-CM | POA: Diagnosis not present

## 2014-11-14 DIAGNOSIS — M24541 Contracture, right hand: Secondary | ICD-10-CM | POA: Diagnosis not present

## 2014-12-05 DIAGNOSIS — M2450 Contracture, unspecified joint: Secondary | ICD-10-CM | POA: Diagnosis not present

## 2014-12-05 DIAGNOSIS — I639 Cerebral infarction, unspecified: Secondary | ICD-10-CM | POA: Diagnosis not present

## 2014-12-07 DIAGNOSIS — Z7901 Long term (current) use of anticoagulants: Secondary | ICD-10-CM | POA: Diagnosis not present

## 2014-12-14 DIAGNOSIS — Z7901 Long term (current) use of anticoagulants: Secondary | ICD-10-CM | POA: Diagnosis not present

## 2014-12-28 DIAGNOSIS — Z7901 Long term (current) use of anticoagulants: Secondary | ICD-10-CM | POA: Diagnosis not present

## 2015-01-01 DIAGNOSIS — C189 Malignant neoplasm of colon, unspecified: Secondary | ICD-10-CM | POA: Diagnosis not present

## 2015-01-01 DIAGNOSIS — R911 Solitary pulmonary nodule: Secondary | ICD-10-CM | POA: Diagnosis not present

## 2015-01-01 DIAGNOSIS — Z9049 Acquired absence of other specified parts of digestive tract: Secondary | ICD-10-CM | POA: Diagnosis not present

## 2015-01-01 DIAGNOSIS — Z978 Presence of other specified devices: Secondary | ICD-10-CM | POA: Diagnosis not present

## 2015-01-01 DIAGNOSIS — Z9221 Personal history of antineoplastic chemotherapy: Secondary | ICD-10-CM | POA: Diagnosis not present

## 2015-01-11 DIAGNOSIS — Z7901 Long term (current) use of anticoagulants: Secondary | ICD-10-CM | POA: Diagnosis not present

## 2015-02-01 DIAGNOSIS — Z23 Encounter for immunization: Secondary | ICD-10-CM | POA: Diagnosis not present

## 2015-02-01 DIAGNOSIS — Z7901 Long term (current) use of anticoagulants: Secondary | ICD-10-CM | POA: Diagnosis not present

## 2015-02-15 DIAGNOSIS — Z7901 Long term (current) use of anticoagulants: Secondary | ICD-10-CM | POA: Diagnosis not present

## 2015-02-20 DIAGNOSIS — Z23 Encounter for immunization: Secondary | ICD-10-CM | POA: Diagnosis not present

## 2015-02-28 DIAGNOSIS — G8113 Spastic hemiplegia affecting right nondominant side: Secondary | ICD-10-CM | POA: Diagnosis not present

## 2015-02-28 DIAGNOSIS — I609 Nontraumatic subarachnoid hemorrhage, unspecified: Secondary | ICD-10-CM | POA: Diagnosis not present

## 2015-02-28 DIAGNOSIS — I639 Cerebral infarction, unspecified: Secondary | ICD-10-CM | POA: Diagnosis not present

## 2015-03-05 DIAGNOSIS — C189 Malignant neoplasm of colon, unspecified: Secondary | ICD-10-CM | POA: Diagnosis not present

## 2015-03-18 DIAGNOSIS — Z7901 Long term (current) use of anticoagulants: Secondary | ICD-10-CM | POA: Diagnosis not present

## 2015-03-19 DIAGNOSIS — G8113 Spastic hemiplegia affecting right nondominant side: Secondary | ICD-10-CM | POA: Diagnosis not present

## 2015-03-19 DIAGNOSIS — R269 Unspecified abnormalities of gait and mobility: Secondary | ICD-10-CM | POA: Diagnosis not present

## 2015-03-19 DIAGNOSIS — I609 Nontraumatic subarachnoid hemorrhage, unspecified: Secondary | ICD-10-CM | POA: Diagnosis not present

## 2015-03-19 DIAGNOSIS — I639 Cerebral infarction, unspecified: Secondary | ICD-10-CM | POA: Diagnosis not present

## 2015-03-19 DIAGNOSIS — R279 Unspecified lack of coordination: Secondary | ICD-10-CM | POA: Diagnosis not present

## 2015-03-20 DIAGNOSIS — I609 Nontraumatic subarachnoid hemorrhage, unspecified: Secondary | ICD-10-CM | POA: Diagnosis not present

## 2015-03-20 DIAGNOSIS — I639 Cerebral infarction, unspecified: Secondary | ICD-10-CM | POA: Diagnosis not present

## 2015-03-20 DIAGNOSIS — G8113 Spastic hemiplegia affecting right nondominant side: Secondary | ICD-10-CM | POA: Diagnosis not present

## 2015-03-22 DIAGNOSIS — Z931 Gastrostomy status: Secondary | ICD-10-CM | POA: Diagnosis not present

## 2015-03-22 DIAGNOSIS — I6930 Unspecified sequelae of cerebral infarction: Secondary | ICD-10-CM | POA: Diagnosis not present

## 2015-03-22 DIAGNOSIS — M24542 Contracture, left hand: Secondary | ICD-10-CM | POA: Diagnosis not present

## 2015-03-22 DIAGNOSIS — I10 Essential (primary) hypertension: Secondary | ICD-10-CM | POA: Diagnosis not present

## 2015-03-22 DIAGNOSIS — Z79899 Other long term (current) drug therapy: Secondary | ICD-10-CM | POA: Diagnosis not present

## 2015-03-22 DIAGNOSIS — M24541 Contracture, right hand: Secondary | ICD-10-CM | POA: Diagnosis not present

## 2015-03-22 DIAGNOSIS — C189 Malignant neoplasm of colon, unspecified: Secondary | ICD-10-CM | POA: Diagnosis not present

## 2015-03-22 DIAGNOSIS — R413 Other amnesia: Secondary | ICD-10-CM | POA: Diagnosis not present

## 2015-03-27 DIAGNOSIS — I609 Nontraumatic subarachnoid hemorrhage, unspecified: Secondary | ICD-10-CM | POA: Diagnosis not present

## 2015-03-27 DIAGNOSIS — I639 Cerebral infarction, unspecified: Secondary | ICD-10-CM | POA: Diagnosis not present

## 2015-03-27 DIAGNOSIS — G8113 Spastic hemiplegia affecting right nondominant side: Secondary | ICD-10-CM | POA: Diagnosis not present

## 2015-03-27 DIAGNOSIS — R269 Unspecified abnormalities of gait and mobility: Secondary | ICD-10-CM | POA: Diagnosis not present

## 2015-03-27 DIAGNOSIS — R279 Unspecified lack of coordination: Secondary | ICD-10-CM | POA: Diagnosis not present

## 2015-03-29 DIAGNOSIS — R269 Unspecified abnormalities of gait and mobility: Secondary | ICD-10-CM | POA: Diagnosis not present

## 2015-03-29 DIAGNOSIS — I639 Cerebral infarction, unspecified: Secondary | ICD-10-CM | POA: Diagnosis not present

## 2015-03-29 DIAGNOSIS — G8113 Spastic hemiplegia affecting right nondominant side: Secondary | ICD-10-CM | POA: Diagnosis not present

## 2015-03-29 DIAGNOSIS — I609 Nontraumatic subarachnoid hemorrhage, unspecified: Secondary | ICD-10-CM | POA: Diagnosis not present

## 2015-03-29 DIAGNOSIS — R279 Unspecified lack of coordination: Secondary | ICD-10-CM | POA: Diagnosis not present

## 2015-04-01 DIAGNOSIS — Z79899 Other long term (current) drug therapy: Secondary | ICD-10-CM | POA: Diagnosis not present

## 2015-04-01 DIAGNOSIS — Z7901 Long term (current) use of anticoagulants: Secondary | ICD-10-CM | POA: Diagnosis not present

## 2015-04-01 DIAGNOSIS — R413 Other amnesia: Secondary | ICD-10-CM | POA: Diagnosis not present

## 2015-04-03 DIAGNOSIS — R269 Unspecified abnormalities of gait and mobility: Secondary | ICD-10-CM | POA: Diagnosis not present

## 2015-04-03 DIAGNOSIS — R279 Unspecified lack of coordination: Secondary | ICD-10-CM | POA: Diagnosis not present

## 2015-04-03 DIAGNOSIS — I609 Nontraumatic subarachnoid hemorrhage, unspecified: Secondary | ICD-10-CM | POA: Diagnosis not present

## 2015-04-03 DIAGNOSIS — G8113 Spastic hemiplegia affecting right nondominant side: Secondary | ICD-10-CM | POA: Diagnosis not present

## 2015-04-03 DIAGNOSIS — I639 Cerebral infarction, unspecified: Secondary | ICD-10-CM | POA: Diagnosis not present

## 2015-04-05 DIAGNOSIS — I609 Nontraumatic subarachnoid hemorrhage, unspecified: Secondary | ICD-10-CM | POA: Diagnosis not present

## 2015-04-05 DIAGNOSIS — R279 Unspecified lack of coordination: Secondary | ICD-10-CM | POA: Diagnosis not present

## 2015-04-05 DIAGNOSIS — R269 Unspecified abnormalities of gait and mobility: Secondary | ICD-10-CM | POA: Diagnosis not present

## 2015-04-05 DIAGNOSIS — G8113 Spastic hemiplegia affecting right nondominant side: Secondary | ICD-10-CM | POA: Diagnosis not present

## 2015-04-05 DIAGNOSIS — I639 Cerebral infarction, unspecified: Secondary | ICD-10-CM | POA: Diagnosis not present

## 2015-04-10 DIAGNOSIS — R269 Unspecified abnormalities of gait and mobility: Secondary | ICD-10-CM | POA: Diagnosis not present

## 2015-04-10 DIAGNOSIS — I639 Cerebral infarction, unspecified: Secondary | ICD-10-CM | POA: Diagnosis not present

## 2015-04-10 DIAGNOSIS — Z8673 Personal history of transient ischemic attack (TIA), and cerebral infarction without residual deficits: Secondary | ICD-10-CM | POA: Diagnosis not present

## 2015-04-10 DIAGNOSIS — I609 Nontraumatic subarachnoid hemorrhage, unspecified: Secondary | ICD-10-CM | POA: Diagnosis not present

## 2015-04-10 DIAGNOSIS — G8113 Spastic hemiplegia affecting right nondominant side: Secondary | ICD-10-CM | POA: Diagnosis not present

## 2015-04-10 DIAGNOSIS — R279 Unspecified lack of coordination: Secondary | ICD-10-CM | POA: Diagnosis not present

## 2015-04-12 DIAGNOSIS — R269 Unspecified abnormalities of gait and mobility: Secondary | ICD-10-CM | POA: Diagnosis not present

## 2015-04-12 DIAGNOSIS — Z8673 Personal history of transient ischemic attack (TIA), and cerebral infarction without residual deficits: Secondary | ICD-10-CM | POA: Diagnosis not present

## 2015-04-12 DIAGNOSIS — I69398 Other sequelae of cerebral infarction: Secondary | ICD-10-CM | POA: Diagnosis not present

## 2015-04-12 DIAGNOSIS — R279 Unspecified lack of coordination: Secondary | ICD-10-CM | POA: Diagnosis not present

## 2015-04-12 DIAGNOSIS — G8113 Spastic hemiplegia affecting right nondominant side: Secondary | ICD-10-CM | POA: Diagnosis not present

## 2015-04-12 DIAGNOSIS — M24541 Contracture, right hand: Secondary | ICD-10-CM | POA: Diagnosis not present

## 2015-04-12 DIAGNOSIS — I639 Cerebral infarction, unspecified: Secondary | ICD-10-CM | POA: Diagnosis not present

## 2015-04-12 DIAGNOSIS — G8114 Spastic hemiplegia affecting left nondominant side: Secondary | ICD-10-CM | POA: Diagnosis not present

## 2015-04-12 DIAGNOSIS — I609 Nontraumatic subarachnoid hemorrhage, unspecified: Secondary | ICD-10-CM | POA: Diagnosis not present

## 2015-04-12 DIAGNOSIS — Z7901 Long term (current) use of anticoagulants: Secondary | ICD-10-CM | POA: Diagnosis not present

## 2015-04-12 DIAGNOSIS — Z85038 Personal history of other malignant neoplasm of large intestine: Secondary | ICD-10-CM | POA: Diagnosis not present

## 2015-04-12 DIAGNOSIS — M24542 Contracture, left hand: Secondary | ICD-10-CM | POA: Diagnosis not present

## 2015-04-17 DIAGNOSIS — I609 Nontraumatic subarachnoid hemorrhage, unspecified: Secondary | ICD-10-CM | POA: Diagnosis not present

## 2015-04-17 DIAGNOSIS — R269 Unspecified abnormalities of gait and mobility: Secondary | ICD-10-CM | POA: Diagnosis not present

## 2015-04-17 DIAGNOSIS — R279 Unspecified lack of coordination: Secondary | ICD-10-CM | POA: Diagnosis not present

## 2015-04-17 DIAGNOSIS — G8113 Spastic hemiplegia affecting right nondominant side: Secondary | ICD-10-CM | POA: Diagnosis not present

## 2015-04-17 DIAGNOSIS — I639 Cerebral infarction, unspecified: Secondary | ICD-10-CM | POA: Diagnosis not present

## 2015-04-17 DIAGNOSIS — Z8673 Personal history of transient ischemic attack (TIA), and cerebral infarction without residual deficits: Secondary | ICD-10-CM | POA: Diagnosis not present

## 2015-04-19 DIAGNOSIS — Z8673 Personal history of transient ischemic attack (TIA), and cerebral infarction without residual deficits: Secondary | ICD-10-CM | POA: Diagnosis not present

## 2015-04-19 DIAGNOSIS — I639 Cerebral infarction, unspecified: Secondary | ICD-10-CM | POA: Diagnosis not present

## 2015-04-19 DIAGNOSIS — G8113 Spastic hemiplegia affecting right nondominant side: Secondary | ICD-10-CM | POA: Diagnosis not present

## 2015-04-19 DIAGNOSIS — R279 Unspecified lack of coordination: Secondary | ICD-10-CM | POA: Diagnosis not present

## 2015-04-19 DIAGNOSIS — R269 Unspecified abnormalities of gait and mobility: Secondary | ICD-10-CM | POA: Diagnosis not present

## 2015-04-19 DIAGNOSIS — I609 Nontraumatic subarachnoid hemorrhage, unspecified: Secondary | ICD-10-CM | POA: Diagnosis not present

## 2015-04-22 DIAGNOSIS — Z7901 Long term (current) use of anticoagulants: Secondary | ICD-10-CM | POA: Diagnosis not present

## 2015-04-24 DIAGNOSIS — G8113 Spastic hemiplegia affecting right nondominant side: Secondary | ICD-10-CM | POA: Diagnosis not present

## 2015-04-24 DIAGNOSIS — I609 Nontraumatic subarachnoid hemorrhage, unspecified: Secondary | ICD-10-CM | POA: Diagnosis not present

## 2015-04-24 DIAGNOSIS — Z8673 Personal history of transient ischemic attack (TIA), and cerebral infarction without residual deficits: Secondary | ICD-10-CM | POA: Diagnosis not present

## 2015-04-26 DIAGNOSIS — I609 Nontraumatic subarachnoid hemorrhage, unspecified: Secondary | ICD-10-CM | POA: Diagnosis not present

## 2015-04-26 DIAGNOSIS — G8113 Spastic hemiplegia affecting right nondominant side: Secondary | ICD-10-CM | POA: Diagnosis not present

## 2015-04-26 DIAGNOSIS — Z8673 Personal history of transient ischemic attack (TIA), and cerebral infarction without residual deficits: Secondary | ICD-10-CM | POA: Diagnosis not present

## 2015-05-08 DIAGNOSIS — R269 Unspecified abnormalities of gait and mobility: Secondary | ICD-10-CM | POA: Diagnosis not present

## 2015-05-08 DIAGNOSIS — G8113 Spastic hemiplegia affecting right nondominant side: Secondary | ICD-10-CM | POA: Diagnosis not present

## 2015-05-08 DIAGNOSIS — I639 Cerebral infarction, unspecified: Secondary | ICD-10-CM | POA: Diagnosis not present

## 2015-05-08 DIAGNOSIS — R279 Unspecified lack of coordination: Secondary | ICD-10-CM | POA: Diagnosis not present

## 2015-05-08 DIAGNOSIS — I609 Nontraumatic subarachnoid hemorrhage, unspecified: Secondary | ICD-10-CM | POA: Diagnosis not present

## 2015-05-10 DIAGNOSIS — G8113 Spastic hemiplegia affecting right nondominant side: Secondary | ICD-10-CM | POA: Diagnosis not present

## 2015-05-10 DIAGNOSIS — R279 Unspecified lack of coordination: Secondary | ICD-10-CM | POA: Diagnosis not present

## 2015-05-10 DIAGNOSIS — R269 Unspecified abnormalities of gait and mobility: Secondary | ICD-10-CM | POA: Diagnosis not present

## 2015-05-10 DIAGNOSIS — I639 Cerebral infarction, unspecified: Secondary | ICD-10-CM | POA: Diagnosis not present

## 2015-05-10 DIAGNOSIS — I609 Nontraumatic subarachnoid hemorrhage, unspecified: Secondary | ICD-10-CM | POA: Diagnosis not present

## 2015-05-15 DIAGNOSIS — G8113 Spastic hemiplegia affecting right nondominant side: Secondary | ICD-10-CM | POA: Diagnosis not present

## 2015-05-15 DIAGNOSIS — I639 Cerebral infarction, unspecified: Secondary | ICD-10-CM | POA: Diagnosis not present

## 2015-05-15 DIAGNOSIS — I609 Nontraumatic subarachnoid hemorrhage, unspecified: Secondary | ICD-10-CM | POA: Diagnosis not present

## 2015-05-17 DIAGNOSIS — I639 Cerebral infarction, unspecified: Secondary | ICD-10-CM | POA: Diagnosis not present

## 2015-05-17 DIAGNOSIS — I609 Nontraumatic subarachnoid hemorrhage, unspecified: Secondary | ICD-10-CM | POA: Diagnosis not present

## 2015-05-17 DIAGNOSIS — R269 Unspecified abnormalities of gait and mobility: Secondary | ICD-10-CM | POA: Diagnosis not present

## 2015-05-17 DIAGNOSIS — R279 Unspecified lack of coordination: Secondary | ICD-10-CM | POA: Diagnosis not present

## 2015-05-17 DIAGNOSIS — G8113 Spastic hemiplegia affecting right nondominant side: Secondary | ICD-10-CM | POA: Diagnosis not present

## 2015-05-20 DIAGNOSIS — Z7901 Long term (current) use of anticoagulants: Secondary | ICD-10-CM | POA: Diagnosis not present

## 2015-05-22 DIAGNOSIS — R269 Unspecified abnormalities of gait and mobility: Secondary | ICD-10-CM | POA: Diagnosis not present

## 2015-05-22 DIAGNOSIS — I609 Nontraumatic subarachnoid hemorrhage, unspecified: Secondary | ICD-10-CM | POA: Diagnosis not present

## 2015-05-22 DIAGNOSIS — I639 Cerebral infarction, unspecified: Secondary | ICD-10-CM | POA: Diagnosis not present

## 2015-05-22 DIAGNOSIS — R279 Unspecified lack of coordination: Secondary | ICD-10-CM | POA: Diagnosis not present

## 2015-05-22 DIAGNOSIS — G8113 Spastic hemiplegia affecting right nondominant side: Secondary | ICD-10-CM | POA: Diagnosis not present

## 2015-05-24 DIAGNOSIS — R279 Unspecified lack of coordination: Secondary | ICD-10-CM | POA: Diagnosis not present

## 2015-05-24 DIAGNOSIS — G8113 Spastic hemiplegia affecting right nondominant side: Secondary | ICD-10-CM | POA: Diagnosis not present

## 2015-05-24 DIAGNOSIS — I609 Nontraumatic subarachnoid hemorrhage, unspecified: Secondary | ICD-10-CM | POA: Diagnosis not present

## 2015-05-24 DIAGNOSIS — R269 Unspecified abnormalities of gait and mobility: Secondary | ICD-10-CM | POA: Diagnosis not present

## 2015-05-24 DIAGNOSIS — I639 Cerebral infarction, unspecified: Secondary | ICD-10-CM | POA: Diagnosis not present

## 2015-06-05 DIAGNOSIS — I609 Nontraumatic subarachnoid hemorrhage, unspecified: Secondary | ICD-10-CM | POA: Diagnosis not present

## 2015-06-05 DIAGNOSIS — R269 Unspecified abnormalities of gait and mobility: Secondary | ICD-10-CM | POA: Diagnosis not present

## 2015-06-05 DIAGNOSIS — R279 Unspecified lack of coordination: Secondary | ICD-10-CM | POA: Diagnosis not present

## 2015-06-05 DIAGNOSIS — I639 Cerebral infarction, unspecified: Secondary | ICD-10-CM | POA: Diagnosis not present

## 2015-06-05 DIAGNOSIS — G8113 Spastic hemiplegia affecting right nondominant side: Secondary | ICD-10-CM | POA: Diagnosis not present

## 2015-06-07 DIAGNOSIS — R269 Unspecified abnormalities of gait and mobility: Secondary | ICD-10-CM | POA: Diagnosis not present

## 2015-06-07 DIAGNOSIS — I639 Cerebral infarction, unspecified: Secondary | ICD-10-CM | POA: Diagnosis not present

## 2015-06-07 DIAGNOSIS — R279 Unspecified lack of coordination: Secondary | ICD-10-CM | POA: Diagnosis not present

## 2015-06-07 DIAGNOSIS — I609 Nontraumatic subarachnoid hemorrhage, unspecified: Secondary | ICD-10-CM | POA: Diagnosis not present

## 2015-06-07 DIAGNOSIS — G8113 Spastic hemiplegia affecting right nondominant side: Secondary | ICD-10-CM | POA: Diagnosis not present

## 2015-06-12 DIAGNOSIS — R269 Unspecified abnormalities of gait and mobility: Secondary | ICD-10-CM | POA: Diagnosis not present

## 2015-06-12 DIAGNOSIS — G8113 Spastic hemiplegia affecting right nondominant side: Secondary | ICD-10-CM | POA: Diagnosis not present

## 2015-06-12 DIAGNOSIS — I639 Cerebral infarction, unspecified: Secondary | ICD-10-CM | POA: Diagnosis not present

## 2015-06-12 DIAGNOSIS — I609 Nontraumatic subarachnoid hemorrhage, unspecified: Secondary | ICD-10-CM | POA: Diagnosis not present

## 2015-06-12 DIAGNOSIS — R279 Unspecified lack of coordination: Secondary | ICD-10-CM | POA: Diagnosis not present

## 2015-06-17 DIAGNOSIS — Z7901 Long term (current) use of anticoagulants: Secondary | ICD-10-CM | POA: Diagnosis not present

## 2015-06-23 DIAGNOSIS — I69353 Hemiplegia and hemiparesis following cerebral infarction affecting right non-dominant side: Secondary | ICD-10-CM | POA: Diagnosis not present

## 2015-06-23 DIAGNOSIS — Z7901 Long term (current) use of anticoagulants: Secondary | ICD-10-CM | POA: Diagnosis not present

## 2015-06-23 DIAGNOSIS — Z431 Encounter for attention to gastrostomy: Secondary | ICD-10-CM | POA: Diagnosis not present

## 2015-06-23 DIAGNOSIS — Z85038 Personal history of other malignant neoplasm of large intestine: Secondary | ICD-10-CM | POA: Diagnosis not present

## 2015-06-23 DIAGNOSIS — K9429 Other complications of gastrostomy: Secondary | ICD-10-CM | POA: Diagnosis not present

## 2015-06-23 DIAGNOSIS — R1310 Dysphagia, unspecified: Secondary | ICD-10-CM | POA: Diagnosis not present

## 2015-06-23 DIAGNOSIS — Z9221 Personal history of antineoplastic chemotherapy: Secondary | ICD-10-CM | POA: Diagnosis not present

## 2015-06-23 DIAGNOSIS — K9423 Gastrostomy malfunction: Secondary | ICD-10-CM | POA: Diagnosis not present

## 2015-06-23 DIAGNOSIS — Z4682 Encounter for fitting and adjustment of non-vascular catheter: Secondary | ICD-10-CM | POA: Diagnosis not present

## 2015-06-23 DIAGNOSIS — Z86718 Personal history of other venous thrombosis and embolism: Secondary | ICD-10-CM | POA: Diagnosis not present

## 2015-07-05 DIAGNOSIS — Z7901 Long term (current) use of anticoagulants: Secondary | ICD-10-CM | POA: Diagnosis not present

## 2015-07-10 DIAGNOSIS — I639 Cerebral infarction, unspecified: Secondary | ICD-10-CM | POA: Diagnosis not present

## 2015-07-10 DIAGNOSIS — I739 Peripheral vascular disease, unspecified: Secondary | ICD-10-CM | POA: Diagnosis not present

## 2015-08-02 DIAGNOSIS — Z789 Other specified health status: Secondary | ICD-10-CM | POA: Diagnosis not present

## 2015-08-02 DIAGNOSIS — I10 Essential (primary) hypertension: Secondary | ICD-10-CM | POA: Diagnosis not present

## 2015-08-02 DIAGNOSIS — Z7901 Long term (current) use of anticoagulants: Secondary | ICD-10-CM | POA: Diagnosis not present

## 2015-08-02 DIAGNOSIS — H01009 Unspecified blepharitis unspecified eye, unspecified eyelid: Secondary | ICD-10-CM | POA: Diagnosis not present

## 2015-08-02 DIAGNOSIS — M24542 Contracture, left hand: Secondary | ICD-10-CM | POA: Diagnosis not present

## 2015-08-02 DIAGNOSIS — Z79899 Other long term (current) drug therapy: Secondary | ICD-10-CM | POA: Diagnosis not present

## 2015-08-02 DIAGNOSIS — G8114 Spastic hemiplegia affecting left nondominant side: Secondary | ICD-10-CM | POA: Diagnosis not present

## 2015-08-02 DIAGNOSIS — D696 Thrombocytopenia, unspecified: Secondary | ICD-10-CM | POA: Diagnosis not present

## 2015-08-02 DIAGNOSIS — M24541 Contracture, right hand: Secondary | ICD-10-CM | POA: Diagnosis not present

## 2015-08-02 DIAGNOSIS — G8111 Spastic hemiplegia affecting right dominant side: Secondary | ICD-10-CM | POA: Diagnosis not present

## 2015-08-02 DIAGNOSIS — I6930 Unspecified sequelae of cerebral infarction: Secondary | ICD-10-CM | POA: Diagnosis not present

## 2015-08-13 DIAGNOSIS — Z7901 Long term (current) use of anticoagulants: Secondary | ICD-10-CM | POA: Diagnosis not present

## 2015-08-15 DIAGNOSIS — Z7901 Long term (current) use of anticoagulants: Secondary | ICD-10-CM | POA: Diagnosis not present

## 2015-09-05 DIAGNOSIS — I10 Essential (primary) hypertension: Secondary | ICD-10-CM | POA: Diagnosis not present

## 2015-09-05 DIAGNOSIS — G08 Intracranial and intraspinal phlebitis and thrombophlebitis: Secondary | ICD-10-CM | POA: Diagnosis not present

## 2015-09-05 DIAGNOSIS — Z8673 Personal history of transient ischemic attack (TIA), and cerebral infarction without residual deficits: Secondary | ICD-10-CM | POA: Diagnosis not present

## 2015-09-05 DIAGNOSIS — G8113 Spastic hemiplegia affecting right nondominant side: Secondary | ICD-10-CM | POA: Diagnosis not present

## 2015-09-05 DIAGNOSIS — Z931 Gastrostomy status: Secondary | ICD-10-CM | POA: Diagnosis not present

## 2015-09-05 DIAGNOSIS — Z85038 Personal history of other malignant neoplasm of large intestine: Secondary | ICD-10-CM | POA: Diagnosis not present

## 2015-09-05 DIAGNOSIS — Z7901 Long term (current) use of anticoagulants: Secondary | ICD-10-CM | POA: Diagnosis not present

## 2015-09-05 DIAGNOSIS — G40209 Localization-related (focal) (partial) symptomatic epilepsy and epileptic syndromes with complex partial seizures, not intractable, without status epilepticus: Secondary | ICD-10-CM | POA: Diagnosis not present

## 2015-09-05 DIAGNOSIS — Z86718 Personal history of other venous thrombosis and embolism: Secondary | ICD-10-CM | POA: Diagnosis not present

## 2015-09-05 DIAGNOSIS — I609 Nontraumatic subarachnoid hemorrhage, unspecified: Secondary | ICD-10-CM | POA: Diagnosis not present

## 2015-09-05 DIAGNOSIS — Z9221 Personal history of antineoplastic chemotherapy: Secondary | ICD-10-CM | POA: Diagnosis not present

## 2015-09-10 DIAGNOSIS — Z7901 Long term (current) use of anticoagulants: Secondary | ICD-10-CM | POA: Diagnosis not present

## 2015-09-24 DIAGNOSIS — Z7901 Long term (current) use of anticoagulants: Secondary | ICD-10-CM | POA: Diagnosis not present

## 2015-10-22 DIAGNOSIS — Z7901 Long term (current) use of anticoagulants: Secondary | ICD-10-CM | POA: Diagnosis not present

## 2015-11-18 DIAGNOSIS — M2650 Dentofacial functional abnormalities, unspecified: Secondary | ICD-10-CM | POA: Diagnosis not present

## 2015-11-18 DIAGNOSIS — I639 Cerebral infarction, unspecified: Secondary | ICD-10-CM | POA: Diagnosis not present

## 2015-11-29 DIAGNOSIS — Z1389 Encounter for screening for other disorder: Secondary | ICD-10-CM | POA: Diagnosis not present

## 2015-11-29 DIAGNOSIS — G8111 Spastic hemiplegia affecting right dominant side: Secondary | ICD-10-CM | POA: Diagnosis not present

## 2015-11-29 DIAGNOSIS — M24541 Contracture, right hand: Secondary | ICD-10-CM | POA: Diagnosis not present

## 2015-11-29 DIAGNOSIS — M24542 Contracture, left hand: Secondary | ICD-10-CM | POA: Diagnosis not present

## 2015-11-29 DIAGNOSIS — I10 Essential (primary) hypertension: Secondary | ICD-10-CM | POA: Diagnosis not present

## 2015-11-29 DIAGNOSIS — Z8673 Personal history of transient ischemic attack (TIA), and cerebral infarction without residual deficits: Secondary | ICD-10-CM | POA: Diagnosis not present

## 2015-11-29 DIAGNOSIS — G8114 Spastic hemiplegia affecting left nondominant side: Secondary | ICD-10-CM | POA: Diagnosis not present

## 2015-12-31 DIAGNOSIS — Z931 Gastrostomy status: Secondary | ICD-10-CM | POA: Diagnosis not present

## 2015-12-31 DIAGNOSIS — Z85038 Personal history of other malignant neoplasm of large intestine: Secondary | ICD-10-CM | POA: Diagnosis not present

## 2015-12-31 DIAGNOSIS — I10 Essential (primary) hypertension: Secondary | ICD-10-CM | POA: Diagnosis not present

## 2015-12-31 DIAGNOSIS — Z8673 Personal history of transient ischemic attack (TIA), and cerebral infarction without residual deficits: Secondary | ICD-10-CM | POA: Diagnosis not present

## 2015-12-31 DIAGNOSIS — Z7901 Long term (current) use of anticoagulants: Secondary | ICD-10-CM | POA: Diagnosis not present

## 2015-12-31 DIAGNOSIS — C182 Malignant neoplasm of ascending colon: Secondary | ICD-10-CM | POA: Diagnosis not present

## 2015-12-31 DIAGNOSIS — Z08 Encounter for follow-up examination after completed treatment for malignant neoplasm: Secondary | ICD-10-CM | POA: Diagnosis not present

## 2015-12-31 DIAGNOSIS — Z7982 Long term (current) use of aspirin: Secondary | ICD-10-CM | POA: Diagnosis not present

## 2016-02-18 DIAGNOSIS — I609 Nontraumatic subarachnoid hemorrhage, unspecified: Secondary | ICD-10-CM | POA: Diagnosis not present

## 2016-02-18 DIAGNOSIS — G08 Intracranial and intraspinal phlebitis and thrombophlebitis: Secondary | ICD-10-CM | POA: Diagnosis not present

## 2016-02-18 DIAGNOSIS — Z85038 Personal history of other malignant neoplasm of large intestine: Secondary | ICD-10-CM | POA: Diagnosis not present

## 2016-02-18 DIAGNOSIS — I10 Essential (primary) hypertension: Secondary | ICD-10-CM | POA: Diagnosis not present

## 2016-02-18 DIAGNOSIS — G40209 Localization-related (focal) (partial) symptomatic epilepsy and epileptic syndromes with complex partial seizures, not intractable, without status epilepticus: Secondary | ICD-10-CM | POA: Diagnosis not present

## 2016-02-18 DIAGNOSIS — Z7982 Long term (current) use of aspirin: Secondary | ICD-10-CM | POA: Diagnosis not present

## 2016-02-18 DIAGNOSIS — Z8673 Personal history of transient ischemic attack (TIA), and cerebral infarction without residual deficits: Secondary | ICD-10-CM | POA: Diagnosis not present

## 2016-02-18 DIAGNOSIS — Z9181 History of falling: Secondary | ICD-10-CM | POA: Diagnosis not present

## 2016-03-06 DIAGNOSIS — Z86718 Personal history of other venous thrombosis and embolism: Secondary | ICD-10-CM | POA: Diagnosis not present

## 2016-03-06 DIAGNOSIS — M24541 Contracture, right hand: Secondary | ICD-10-CM | POA: Diagnosis not present

## 2016-03-06 DIAGNOSIS — G8111 Spastic hemiplegia affecting right dominant side: Secondary | ICD-10-CM | POA: Diagnosis not present

## 2016-03-06 DIAGNOSIS — G8112 Spastic hemiplegia affecting left dominant side: Secondary | ICD-10-CM | POA: Diagnosis not present

## 2016-03-06 DIAGNOSIS — M24542 Contracture, left hand: Secondary | ICD-10-CM | POA: Diagnosis not present

## 2016-03-06 DIAGNOSIS — Z8673 Personal history of transient ischemic attack (TIA), and cerebral infarction without residual deficits: Secondary | ICD-10-CM | POA: Diagnosis not present

## 2016-04-02 DIAGNOSIS — I639 Cerebral infarction, unspecified: Secondary | ICD-10-CM | POA: Diagnosis not present

## 2016-04-02 DIAGNOSIS — R634 Abnormal weight loss: Secondary | ICD-10-CM | POA: Diagnosis not present

## 2016-04-02 DIAGNOSIS — R7303 Prediabetes: Secondary | ICD-10-CM | POA: Diagnosis not present

## 2016-04-02 DIAGNOSIS — Z79899 Other long term (current) drug therapy: Secondary | ICD-10-CM | POA: Diagnosis not present

## 2016-06-01 DIAGNOSIS — Z0000 Encounter for general adult medical examination without abnormal findings: Secondary | ICD-10-CM | POA: Diagnosis not present

## 2016-06-01 DIAGNOSIS — R5383 Other fatigue: Secondary | ICD-10-CM | POA: Diagnosis not present

## 2016-06-01 DIAGNOSIS — I6930 Unspecified sequelae of cerebral infarction: Secondary | ICD-10-CM | POA: Diagnosis not present

## 2016-06-01 DIAGNOSIS — Z85038 Personal history of other malignant neoplasm of large intestine: Secondary | ICD-10-CM | POA: Diagnosis not present

## 2016-06-01 DIAGNOSIS — D696 Thrombocytopenia, unspecified: Secondary | ICD-10-CM | POA: Diagnosis not present

## 2016-06-01 DIAGNOSIS — Z931 Gastrostomy status: Secondary | ICD-10-CM | POA: Diagnosis not present

## 2016-06-01 DIAGNOSIS — I10 Essential (primary) hypertension: Secondary | ICD-10-CM | POA: Diagnosis not present

## 2016-06-01 DIAGNOSIS — L89153 Pressure ulcer of sacral region, stage 3: Secondary | ICD-10-CM | POA: Diagnosis not present

## 2016-06-01 DIAGNOSIS — Z431 Encounter for attention to gastrostomy: Secondary | ICD-10-CM | POA: Diagnosis not present

## 2016-06-01 DIAGNOSIS — Z1389 Encounter for screening for other disorder: Secondary | ICD-10-CM | POA: Diagnosis not present

## 2016-06-01 DIAGNOSIS — Z79899 Other long term (current) drug therapy: Secondary | ICD-10-CM | POA: Diagnosis not present

## 2016-06-02 DIAGNOSIS — Z85038 Personal history of other malignant neoplasm of large intestine: Secondary | ICD-10-CM | POA: Diagnosis not present

## 2016-06-02 DIAGNOSIS — K9429 Other complications of gastrostomy: Secondary | ICD-10-CM | POA: Diagnosis not present

## 2016-06-02 DIAGNOSIS — Z931 Gastrostomy status: Secondary | ICD-10-CM | POA: Diagnosis not present

## 2016-06-02 DIAGNOSIS — I10 Essential (primary) hypertension: Secondary | ICD-10-CM | POA: Diagnosis not present

## 2016-06-02 DIAGNOSIS — Z7982 Long term (current) use of aspirin: Secondary | ICD-10-CM | POA: Diagnosis not present

## 2016-06-02 DIAGNOSIS — Z8673 Personal history of transient ischemic attack (TIA), and cerebral infarction without residual deficits: Secondary | ICD-10-CM | POA: Diagnosis not present

## 2016-06-03 DIAGNOSIS — I69391 Dysphagia following cerebral infarction: Secondary | ICD-10-CM | POA: Diagnosis not present

## 2016-06-03 DIAGNOSIS — D696 Thrombocytopenia, unspecified: Secondary | ICD-10-CM | POA: Diagnosis not present

## 2016-06-03 DIAGNOSIS — I10 Essential (primary) hypertension: Secondary | ICD-10-CM | POA: Diagnosis not present

## 2016-06-03 DIAGNOSIS — I69351 Hemiplegia and hemiparesis following cerebral infarction affecting right dominant side: Secondary | ICD-10-CM | POA: Diagnosis not present

## 2016-06-03 DIAGNOSIS — L89150 Pressure ulcer of sacral region, unstageable: Secondary | ICD-10-CM | POA: Diagnosis not present

## 2016-06-03 DIAGNOSIS — R1310 Dysphagia, unspecified: Secondary | ICD-10-CM | POA: Diagnosis not present

## 2016-06-05 DIAGNOSIS — C189 Malignant neoplasm of colon, unspecified: Secondary | ICD-10-CM | POA: Diagnosis not present

## 2016-06-08 DIAGNOSIS — I69391 Dysphagia following cerebral infarction: Secondary | ICD-10-CM | POA: Diagnosis not present

## 2016-06-08 DIAGNOSIS — I69351 Hemiplegia and hemiparesis following cerebral infarction affecting right dominant side: Secondary | ICD-10-CM | POA: Diagnosis not present

## 2016-06-08 DIAGNOSIS — L89150 Pressure ulcer of sacral region, unstageable: Secondary | ICD-10-CM | POA: Diagnosis not present

## 2016-06-08 DIAGNOSIS — I10 Essential (primary) hypertension: Secondary | ICD-10-CM | POA: Diagnosis not present

## 2016-06-08 DIAGNOSIS — R1310 Dysphagia, unspecified: Secondary | ICD-10-CM | POA: Diagnosis not present

## 2016-06-08 DIAGNOSIS — D696 Thrombocytopenia, unspecified: Secondary | ICD-10-CM | POA: Diagnosis not present

## 2016-06-12 DIAGNOSIS — I69351 Hemiplegia and hemiparesis following cerebral infarction affecting right dominant side: Secondary | ICD-10-CM | POA: Diagnosis not present

## 2016-06-12 DIAGNOSIS — L89150 Pressure ulcer of sacral region, unstageable: Secondary | ICD-10-CM | POA: Diagnosis not present

## 2016-06-12 DIAGNOSIS — I10 Essential (primary) hypertension: Secondary | ICD-10-CM | POA: Diagnosis not present

## 2016-06-12 DIAGNOSIS — D696 Thrombocytopenia, unspecified: Secondary | ICD-10-CM | POA: Diagnosis not present

## 2016-06-12 DIAGNOSIS — R1310 Dysphagia, unspecified: Secondary | ICD-10-CM | POA: Diagnosis not present

## 2016-06-12 DIAGNOSIS — I69391 Dysphagia following cerebral infarction: Secondary | ICD-10-CM | POA: Diagnosis not present

## 2016-06-15 DIAGNOSIS — I10 Essential (primary) hypertension: Secondary | ICD-10-CM | POA: Diagnosis not present

## 2016-06-15 DIAGNOSIS — L89150 Pressure ulcer of sacral region, unstageable: Secondary | ICD-10-CM | POA: Diagnosis not present

## 2016-06-15 DIAGNOSIS — I69391 Dysphagia following cerebral infarction: Secondary | ICD-10-CM | POA: Diagnosis not present

## 2016-06-15 DIAGNOSIS — I69351 Hemiplegia and hemiparesis following cerebral infarction affecting right dominant side: Secondary | ICD-10-CM | POA: Diagnosis not present

## 2016-06-15 DIAGNOSIS — R1310 Dysphagia, unspecified: Secondary | ICD-10-CM | POA: Diagnosis not present

## 2016-06-15 DIAGNOSIS — D696 Thrombocytopenia, unspecified: Secondary | ICD-10-CM | POA: Diagnosis not present

## 2016-06-19 DIAGNOSIS — R1310 Dysphagia, unspecified: Secondary | ICD-10-CM | POA: Diagnosis not present

## 2016-06-19 DIAGNOSIS — I69351 Hemiplegia and hemiparesis following cerebral infarction affecting right dominant side: Secondary | ICD-10-CM | POA: Diagnosis not present

## 2016-06-19 DIAGNOSIS — L89150 Pressure ulcer of sacral region, unstageable: Secondary | ICD-10-CM | POA: Diagnosis not present

## 2016-06-19 DIAGNOSIS — I69391 Dysphagia following cerebral infarction: Secondary | ICD-10-CM | POA: Diagnosis not present

## 2016-06-19 DIAGNOSIS — I10 Essential (primary) hypertension: Secondary | ICD-10-CM | POA: Diagnosis not present

## 2016-06-19 DIAGNOSIS — D696 Thrombocytopenia, unspecified: Secondary | ICD-10-CM | POA: Diagnosis not present

## 2016-06-22 DIAGNOSIS — I69351 Hemiplegia and hemiparesis following cerebral infarction affecting right dominant side: Secondary | ICD-10-CM | POA: Diagnosis not present

## 2016-06-22 DIAGNOSIS — I10 Essential (primary) hypertension: Secondary | ICD-10-CM | POA: Diagnosis not present

## 2016-06-22 DIAGNOSIS — L89150 Pressure ulcer of sacral region, unstageable: Secondary | ICD-10-CM | POA: Diagnosis not present

## 2016-06-22 DIAGNOSIS — I69391 Dysphagia following cerebral infarction: Secondary | ICD-10-CM | POA: Diagnosis not present

## 2016-06-22 DIAGNOSIS — D696 Thrombocytopenia, unspecified: Secondary | ICD-10-CM | POA: Diagnosis not present

## 2016-06-22 DIAGNOSIS — R1310 Dysphagia, unspecified: Secondary | ICD-10-CM | POA: Diagnosis not present

## 2016-06-26 DIAGNOSIS — I69391 Dysphagia following cerebral infarction: Secondary | ICD-10-CM | POA: Diagnosis not present

## 2016-06-26 DIAGNOSIS — I10 Essential (primary) hypertension: Secondary | ICD-10-CM | POA: Diagnosis not present

## 2016-06-26 DIAGNOSIS — I69351 Hemiplegia and hemiparesis following cerebral infarction affecting right dominant side: Secondary | ICD-10-CM | POA: Diagnosis not present

## 2016-06-26 DIAGNOSIS — R1310 Dysphagia, unspecified: Secondary | ICD-10-CM | POA: Diagnosis not present

## 2016-06-26 DIAGNOSIS — D696 Thrombocytopenia, unspecified: Secondary | ICD-10-CM | POA: Diagnosis not present

## 2016-06-26 DIAGNOSIS — L89150 Pressure ulcer of sacral region, unstageable: Secondary | ICD-10-CM | POA: Diagnosis not present

## 2016-06-30 DIAGNOSIS — L89150 Pressure ulcer of sacral region, unstageable: Secondary | ICD-10-CM | POA: Diagnosis not present

## 2016-06-30 DIAGNOSIS — I69351 Hemiplegia and hemiparesis following cerebral infarction affecting right dominant side: Secondary | ICD-10-CM | POA: Diagnosis not present

## 2016-06-30 DIAGNOSIS — D696 Thrombocytopenia, unspecified: Secondary | ICD-10-CM | POA: Diagnosis not present

## 2016-06-30 DIAGNOSIS — I10 Essential (primary) hypertension: Secondary | ICD-10-CM | POA: Diagnosis not present

## 2016-06-30 DIAGNOSIS — I69391 Dysphagia following cerebral infarction: Secondary | ICD-10-CM | POA: Diagnosis not present

## 2016-06-30 DIAGNOSIS — R1310 Dysphagia, unspecified: Secondary | ICD-10-CM | POA: Diagnosis not present

## 2016-07-03 DIAGNOSIS — D696 Thrombocytopenia, unspecified: Secondary | ICD-10-CM | POA: Diagnosis not present

## 2016-07-03 DIAGNOSIS — L89150 Pressure ulcer of sacral region, unstageable: Secondary | ICD-10-CM | POA: Diagnosis not present

## 2016-07-03 DIAGNOSIS — I69391 Dysphagia following cerebral infarction: Secondary | ICD-10-CM | POA: Diagnosis not present

## 2016-07-03 DIAGNOSIS — I10 Essential (primary) hypertension: Secondary | ICD-10-CM | POA: Diagnosis not present

## 2016-07-03 DIAGNOSIS — R1310 Dysphagia, unspecified: Secondary | ICD-10-CM | POA: Diagnosis not present

## 2016-07-03 DIAGNOSIS — I69351 Hemiplegia and hemiparesis following cerebral infarction affecting right dominant side: Secondary | ICD-10-CM | POA: Diagnosis not present

## 2016-07-07 DIAGNOSIS — I69351 Hemiplegia and hemiparesis following cerebral infarction affecting right dominant side: Secondary | ICD-10-CM | POA: Diagnosis not present

## 2016-07-07 DIAGNOSIS — D696 Thrombocytopenia, unspecified: Secondary | ICD-10-CM | POA: Diagnosis not present

## 2016-07-07 DIAGNOSIS — I10 Essential (primary) hypertension: Secondary | ICD-10-CM | POA: Diagnosis not present

## 2016-07-07 DIAGNOSIS — R1310 Dysphagia, unspecified: Secondary | ICD-10-CM | POA: Diagnosis not present

## 2016-07-07 DIAGNOSIS — L89150 Pressure ulcer of sacral region, unstageable: Secondary | ICD-10-CM | POA: Diagnosis not present

## 2016-07-07 DIAGNOSIS — I69391 Dysphagia following cerebral infarction: Secondary | ICD-10-CM | POA: Diagnosis not present

## 2016-07-10 DIAGNOSIS — I10 Essential (primary) hypertension: Secondary | ICD-10-CM | POA: Diagnosis not present

## 2016-07-10 DIAGNOSIS — R1310 Dysphagia, unspecified: Secondary | ICD-10-CM | POA: Diagnosis not present

## 2016-07-10 DIAGNOSIS — I69391 Dysphagia following cerebral infarction: Secondary | ICD-10-CM | POA: Diagnosis not present

## 2016-07-10 DIAGNOSIS — L89150 Pressure ulcer of sacral region, unstageable: Secondary | ICD-10-CM | POA: Diagnosis not present

## 2016-07-10 DIAGNOSIS — D696 Thrombocytopenia, unspecified: Secondary | ICD-10-CM | POA: Diagnosis not present

## 2016-07-10 DIAGNOSIS — I69351 Hemiplegia and hemiparesis following cerebral infarction affecting right dominant side: Secondary | ICD-10-CM | POA: Diagnosis not present

## 2016-07-13 DIAGNOSIS — I69351 Hemiplegia and hemiparesis following cerebral infarction affecting right dominant side: Secondary | ICD-10-CM | POA: Diagnosis not present

## 2016-07-13 DIAGNOSIS — I10 Essential (primary) hypertension: Secondary | ICD-10-CM | POA: Diagnosis not present

## 2016-07-13 DIAGNOSIS — L89150 Pressure ulcer of sacral region, unstageable: Secondary | ICD-10-CM | POA: Diagnosis not present

## 2016-07-13 DIAGNOSIS — I69391 Dysphagia following cerebral infarction: Secondary | ICD-10-CM | POA: Diagnosis not present

## 2016-07-13 DIAGNOSIS — R1310 Dysphagia, unspecified: Secondary | ICD-10-CM | POA: Diagnosis not present

## 2016-07-13 DIAGNOSIS — D696 Thrombocytopenia, unspecified: Secondary | ICD-10-CM | POA: Diagnosis not present

## 2016-07-15 DIAGNOSIS — I69351 Hemiplegia and hemiparesis following cerebral infarction affecting right dominant side: Secondary | ICD-10-CM | POA: Diagnosis not present

## 2016-07-15 DIAGNOSIS — D696 Thrombocytopenia, unspecified: Secondary | ICD-10-CM | POA: Diagnosis not present

## 2016-07-15 DIAGNOSIS — R1310 Dysphagia, unspecified: Secondary | ICD-10-CM | POA: Diagnosis not present

## 2016-07-15 DIAGNOSIS — I10 Essential (primary) hypertension: Secondary | ICD-10-CM | POA: Diagnosis not present

## 2016-07-15 DIAGNOSIS — L89150 Pressure ulcer of sacral region, unstageable: Secondary | ICD-10-CM | POA: Diagnosis not present

## 2016-07-15 DIAGNOSIS — I69391 Dysphagia following cerebral infarction: Secondary | ICD-10-CM | POA: Diagnosis not present

## 2016-07-17 DIAGNOSIS — R1310 Dysphagia, unspecified: Secondary | ICD-10-CM | POA: Diagnosis not present

## 2016-07-17 DIAGNOSIS — I69351 Hemiplegia and hemiparesis following cerebral infarction affecting right dominant side: Secondary | ICD-10-CM | POA: Diagnosis not present

## 2016-07-17 DIAGNOSIS — D696 Thrombocytopenia, unspecified: Secondary | ICD-10-CM | POA: Diagnosis not present

## 2016-07-17 DIAGNOSIS — L89150 Pressure ulcer of sacral region, unstageable: Secondary | ICD-10-CM | POA: Diagnosis not present

## 2016-07-17 DIAGNOSIS — I69391 Dysphagia following cerebral infarction: Secondary | ICD-10-CM | POA: Diagnosis not present

## 2016-07-17 DIAGNOSIS — I10 Essential (primary) hypertension: Secondary | ICD-10-CM | POA: Diagnosis not present

## 2016-07-20 DIAGNOSIS — R1310 Dysphagia, unspecified: Secondary | ICD-10-CM | POA: Diagnosis not present

## 2016-07-20 DIAGNOSIS — I10 Essential (primary) hypertension: Secondary | ICD-10-CM | POA: Diagnosis not present

## 2016-07-20 DIAGNOSIS — D696 Thrombocytopenia, unspecified: Secondary | ICD-10-CM | POA: Diagnosis not present

## 2016-07-20 DIAGNOSIS — I69351 Hemiplegia and hemiparesis following cerebral infarction affecting right dominant side: Secondary | ICD-10-CM | POA: Diagnosis not present

## 2016-07-20 DIAGNOSIS — L89150 Pressure ulcer of sacral region, unstageable: Secondary | ICD-10-CM | POA: Diagnosis not present

## 2016-07-20 DIAGNOSIS — I69391 Dysphagia following cerebral infarction: Secondary | ICD-10-CM | POA: Diagnosis not present

## 2016-07-24 DIAGNOSIS — I10 Essential (primary) hypertension: Secondary | ICD-10-CM | POA: Diagnosis not present

## 2016-07-24 DIAGNOSIS — I69351 Hemiplegia and hemiparesis following cerebral infarction affecting right dominant side: Secondary | ICD-10-CM | POA: Diagnosis not present

## 2016-07-24 DIAGNOSIS — R1310 Dysphagia, unspecified: Secondary | ICD-10-CM | POA: Diagnosis not present

## 2016-07-24 DIAGNOSIS — L89150 Pressure ulcer of sacral region, unstageable: Secondary | ICD-10-CM | POA: Diagnosis not present

## 2016-07-24 DIAGNOSIS — D696 Thrombocytopenia, unspecified: Secondary | ICD-10-CM | POA: Diagnosis not present

## 2016-07-24 DIAGNOSIS — I69391 Dysphagia following cerebral infarction: Secondary | ICD-10-CM | POA: Diagnosis not present

## 2016-07-27 DIAGNOSIS — I69391 Dysphagia following cerebral infarction: Secondary | ICD-10-CM | POA: Diagnosis not present

## 2016-07-27 DIAGNOSIS — R1310 Dysphagia, unspecified: Secondary | ICD-10-CM | POA: Diagnosis not present

## 2016-07-27 DIAGNOSIS — I10 Essential (primary) hypertension: Secondary | ICD-10-CM | POA: Diagnosis not present

## 2016-07-27 DIAGNOSIS — D696 Thrombocytopenia, unspecified: Secondary | ICD-10-CM | POA: Diagnosis not present

## 2016-07-27 DIAGNOSIS — L89150 Pressure ulcer of sacral region, unstageable: Secondary | ICD-10-CM | POA: Diagnosis not present

## 2016-07-27 DIAGNOSIS — I69351 Hemiplegia and hemiparesis following cerebral infarction affecting right dominant side: Secondary | ICD-10-CM | POA: Diagnosis not present

## 2016-07-29 DIAGNOSIS — R1310 Dysphagia, unspecified: Secondary | ICD-10-CM | POA: Diagnosis not present

## 2016-07-29 DIAGNOSIS — D696 Thrombocytopenia, unspecified: Secondary | ICD-10-CM | POA: Diagnosis not present

## 2016-07-29 DIAGNOSIS — I69391 Dysphagia following cerebral infarction: Secondary | ICD-10-CM | POA: Diagnosis not present

## 2016-07-29 DIAGNOSIS — I69351 Hemiplegia and hemiparesis following cerebral infarction affecting right dominant side: Secondary | ICD-10-CM | POA: Diagnosis not present

## 2016-07-29 DIAGNOSIS — L89150 Pressure ulcer of sacral region, unstageable: Secondary | ICD-10-CM | POA: Diagnosis not present

## 2016-07-29 DIAGNOSIS — I10 Essential (primary) hypertension: Secondary | ICD-10-CM | POA: Diagnosis not present

## 2016-07-31 DIAGNOSIS — I10 Essential (primary) hypertension: Secondary | ICD-10-CM | POA: Diagnosis not present

## 2016-07-31 DIAGNOSIS — D696 Thrombocytopenia, unspecified: Secondary | ICD-10-CM | POA: Diagnosis not present

## 2016-07-31 DIAGNOSIS — L89150 Pressure ulcer of sacral region, unstageable: Secondary | ICD-10-CM | POA: Diagnosis not present

## 2016-07-31 DIAGNOSIS — R1310 Dysphagia, unspecified: Secondary | ICD-10-CM | POA: Diagnosis not present

## 2016-07-31 DIAGNOSIS — I69391 Dysphagia following cerebral infarction: Secondary | ICD-10-CM | POA: Diagnosis not present

## 2016-07-31 DIAGNOSIS — I69351 Hemiplegia and hemiparesis following cerebral infarction affecting right dominant side: Secondary | ICD-10-CM | POA: Diagnosis not present

## 2016-08-02 DIAGNOSIS — I69391 Dysphagia following cerebral infarction: Secondary | ICD-10-CM | POA: Diagnosis not present

## 2016-08-02 DIAGNOSIS — L89310 Pressure ulcer of right buttock, unstageable: Secondary | ICD-10-CM | POA: Diagnosis not present

## 2016-08-02 DIAGNOSIS — R1310 Dysphagia, unspecified: Secondary | ICD-10-CM | POA: Diagnosis not present

## 2016-08-02 DIAGNOSIS — I10 Essential (primary) hypertension: Secondary | ICD-10-CM | POA: Diagnosis not present

## 2016-08-02 DIAGNOSIS — D696 Thrombocytopenia, unspecified: Secondary | ICD-10-CM | POA: Diagnosis not present

## 2016-08-02 DIAGNOSIS — L89153 Pressure ulcer of sacral region, stage 3: Secondary | ICD-10-CM | POA: Diagnosis not present

## 2016-08-02 DIAGNOSIS — G40909 Epilepsy, unspecified, not intractable, without status epilepticus: Secondary | ICD-10-CM | POA: Diagnosis not present

## 2016-08-02 DIAGNOSIS — I69351 Hemiplegia and hemiparesis following cerebral infarction affecting right dominant side: Secondary | ICD-10-CM | POA: Diagnosis not present

## 2016-08-03 DIAGNOSIS — I69391 Dysphagia following cerebral infarction: Secondary | ICD-10-CM | POA: Diagnosis not present

## 2016-08-03 DIAGNOSIS — L89310 Pressure ulcer of right buttock, unstageable: Secondary | ICD-10-CM | POA: Diagnosis not present

## 2016-08-03 DIAGNOSIS — I10 Essential (primary) hypertension: Secondary | ICD-10-CM | POA: Diagnosis not present

## 2016-08-03 DIAGNOSIS — R1310 Dysphagia, unspecified: Secondary | ICD-10-CM | POA: Diagnosis not present

## 2016-08-03 DIAGNOSIS — I69351 Hemiplegia and hemiparesis following cerebral infarction affecting right dominant side: Secondary | ICD-10-CM | POA: Diagnosis not present

## 2016-08-03 DIAGNOSIS — L89153 Pressure ulcer of sacral region, stage 3: Secondary | ICD-10-CM | POA: Diagnosis not present

## 2016-08-05 DIAGNOSIS — I69351 Hemiplegia and hemiparesis following cerebral infarction affecting right dominant side: Secondary | ICD-10-CM | POA: Diagnosis not present

## 2016-08-05 DIAGNOSIS — I10 Essential (primary) hypertension: Secondary | ICD-10-CM | POA: Diagnosis not present

## 2016-08-05 DIAGNOSIS — L89153 Pressure ulcer of sacral region, stage 3: Secondary | ICD-10-CM | POA: Diagnosis not present

## 2016-08-05 DIAGNOSIS — I69391 Dysphagia following cerebral infarction: Secondary | ICD-10-CM | POA: Diagnosis not present

## 2016-08-05 DIAGNOSIS — R1310 Dysphagia, unspecified: Secondary | ICD-10-CM | POA: Diagnosis not present

## 2016-08-05 DIAGNOSIS — L89310 Pressure ulcer of right buttock, unstageable: Secondary | ICD-10-CM | POA: Diagnosis not present

## 2016-08-07 DIAGNOSIS — I69351 Hemiplegia and hemiparesis following cerebral infarction affecting right dominant side: Secondary | ICD-10-CM | POA: Diagnosis not present

## 2016-08-07 DIAGNOSIS — L89153 Pressure ulcer of sacral region, stage 3: Secondary | ICD-10-CM | POA: Diagnosis not present

## 2016-08-07 DIAGNOSIS — I69391 Dysphagia following cerebral infarction: Secondary | ICD-10-CM | POA: Diagnosis not present

## 2016-08-07 DIAGNOSIS — R1310 Dysphagia, unspecified: Secondary | ICD-10-CM | POA: Diagnosis not present

## 2016-08-07 DIAGNOSIS — L89310 Pressure ulcer of right buttock, unstageable: Secondary | ICD-10-CM | POA: Diagnosis not present

## 2016-08-07 DIAGNOSIS — I10 Essential (primary) hypertension: Secondary | ICD-10-CM | POA: Diagnosis not present

## 2016-08-10 DIAGNOSIS — I69391 Dysphagia following cerebral infarction: Secondary | ICD-10-CM | POA: Diagnosis not present

## 2016-08-10 DIAGNOSIS — I69351 Hemiplegia and hemiparesis following cerebral infarction affecting right dominant side: Secondary | ICD-10-CM | POA: Diagnosis not present

## 2016-08-10 DIAGNOSIS — I10 Essential (primary) hypertension: Secondary | ICD-10-CM | POA: Diagnosis not present

## 2016-08-10 DIAGNOSIS — L89153 Pressure ulcer of sacral region, stage 3: Secondary | ICD-10-CM | POA: Diagnosis not present

## 2016-08-10 DIAGNOSIS — L89310 Pressure ulcer of right buttock, unstageable: Secondary | ICD-10-CM | POA: Diagnosis not present

## 2016-08-10 DIAGNOSIS — R1310 Dysphagia, unspecified: Secondary | ICD-10-CM | POA: Diagnosis not present

## 2016-08-12 DIAGNOSIS — L89310 Pressure ulcer of right buttock, unstageable: Secondary | ICD-10-CM | POA: Diagnosis not present

## 2016-08-12 DIAGNOSIS — I69351 Hemiplegia and hemiparesis following cerebral infarction affecting right dominant side: Secondary | ICD-10-CM | POA: Diagnosis not present

## 2016-08-12 DIAGNOSIS — L89153 Pressure ulcer of sacral region, stage 3: Secondary | ICD-10-CM | POA: Diagnosis not present

## 2016-08-12 DIAGNOSIS — R1310 Dysphagia, unspecified: Secondary | ICD-10-CM | POA: Diagnosis not present

## 2016-08-12 DIAGNOSIS — I69391 Dysphagia following cerebral infarction: Secondary | ICD-10-CM | POA: Diagnosis not present

## 2016-08-12 DIAGNOSIS — I10 Essential (primary) hypertension: Secondary | ICD-10-CM | POA: Diagnosis not present

## 2016-08-14 DIAGNOSIS — R1310 Dysphagia, unspecified: Secondary | ICD-10-CM | POA: Diagnosis not present

## 2016-08-14 DIAGNOSIS — I69391 Dysphagia following cerebral infarction: Secondary | ICD-10-CM | POA: Diagnosis not present

## 2016-08-14 DIAGNOSIS — L89310 Pressure ulcer of right buttock, unstageable: Secondary | ICD-10-CM | POA: Diagnosis not present

## 2016-08-14 DIAGNOSIS — I69351 Hemiplegia and hemiparesis following cerebral infarction affecting right dominant side: Secondary | ICD-10-CM | POA: Diagnosis not present

## 2016-08-14 DIAGNOSIS — L89153 Pressure ulcer of sacral region, stage 3: Secondary | ICD-10-CM | POA: Diagnosis not present

## 2016-08-14 DIAGNOSIS — I10 Essential (primary) hypertension: Secondary | ICD-10-CM | POA: Diagnosis not present

## 2016-08-17 DIAGNOSIS — I69351 Hemiplegia and hemiparesis following cerebral infarction affecting right dominant side: Secondary | ICD-10-CM | POA: Diagnosis not present

## 2016-08-17 DIAGNOSIS — I10 Essential (primary) hypertension: Secondary | ICD-10-CM | POA: Diagnosis not present

## 2016-08-17 DIAGNOSIS — L89153 Pressure ulcer of sacral region, stage 3: Secondary | ICD-10-CM | POA: Diagnosis not present

## 2016-08-17 DIAGNOSIS — I69391 Dysphagia following cerebral infarction: Secondary | ICD-10-CM | POA: Diagnosis not present

## 2016-08-17 DIAGNOSIS — L89310 Pressure ulcer of right buttock, unstageable: Secondary | ICD-10-CM | POA: Diagnosis not present

## 2016-08-17 DIAGNOSIS — R1310 Dysphagia, unspecified: Secondary | ICD-10-CM | POA: Diagnosis not present

## 2016-08-21 DIAGNOSIS — L89310 Pressure ulcer of right buttock, unstageable: Secondary | ICD-10-CM | POA: Diagnosis not present

## 2016-08-21 DIAGNOSIS — R1310 Dysphagia, unspecified: Secondary | ICD-10-CM | POA: Diagnosis not present

## 2016-08-21 DIAGNOSIS — I69391 Dysphagia following cerebral infarction: Secondary | ICD-10-CM | POA: Diagnosis not present

## 2016-08-21 DIAGNOSIS — L89153 Pressure ulcer of sacral region, stage 3: Secondary | ICD-10-CM | POA: Diagnosis not present

## 2016-08-21 DIAGNOSIS — I69351 Hemiplegia and hemiparesis following cerebral infarction affecting right dominant side: Secondary | ICD-10-CM | POA: Diagnosis not present

## 2016-08-21 DIAGNOSIS — I10 Essential (primary) hypertension: Secondary | ICD-10-CM | POA: Diagnosis not present

## 2016-08-24 DIAGNOSIS — L89153 Pressure ulcer of sacral region, stage 3: Secondary | ICD-10-CM | POA: Diagnosis not present

## 2016-08-24 DIAGNOSIS — L89310 Pressure ulcer of right buttock, unstageable: Secondary | ICD-10-CM | POA: Diagnosis not present

## 2016-08-24 DIAGNOSIS — R1310 Dysphagia, unspecified: Secondary | ICD-10-CM | POA: Diagnosis not present

## 2016-08-24 DIAGNOSIS — I10 Essential (primary) hypertension: Secondary | ICD-10-CM | POA: Diagnosis not present

## 2016-08-24 DIAGNOSIS — I69351 Hemiplegia and hemiparesis following cerebral infarction affecting right dominant side: Secondary | ICD-10-CM | POA: Diagnosis not present

## 2016-08-24 DIAGNOSIS — I69391 Dysphagia following cerebral infarction: Secondary | ICD-10-CM | POA: Diagnosis not present

## 2016-08-26 DIAGNOSIS — I10 Essential (primary) hypertension: Secondary | ICD-10-CM | POA: Diagnosis not present

## 2016-08-26 DIAGNOSIS — R1310 Dysphagia, unspecified: Secondary | ICD-10-CM | POA: Diagnosis not present

## 2016-08-26 DIAGNOSIS — I69391 Dysphagia following cerebral infarction: Secondary | ICD-10-CM | POA: Diagnosis not present

## 2016-08-26 DIAGNOSIS — L89153 Pressure ulcer of sacral region, stage 3: Secondary | ICD-10-CM | POA: Diagnosis not present

## 2016-08-26 DIAGNOSIS — I69351 Hemiplegia and hemiparesis following cerebral infarction affecting right dominant side: Secondary | ICD-10-CM | POA: Diagnosis not present

## 2016-08-26 DIAGNOSIS — L89310 Pressure ulcer of right buttock, unstageable: Secondary | ICD-10-CM | POA: Diagnosis not present

## 2016-08-28 DIAGNOSIS — L89310 Pressure ulcer of right buttock, unstageable: Secondary | ICD-10-CM | POA: Diagnosis not present

## 2016-08-28 DIAGNOSIS — I69391 Dysphagia following cerebral infarction: Secondary | ICD-10-CM | POA: Diagnosis not present

## 2016-08-28 DIAGNOSIS — I69351 Hemiplegia and hemiparesis following cerebral infarction affecting right dominant side: Secondary | ICD-10-CM | POA: Diagnosis not present

## 2016-08-28 DIAGNOSIS — I10 Essential (primary) hypertension: Secondary | ICD-10-CM | POA: Diagnosis not present

## 2016-08-28 DIAGNOSIS — L89153 Pressure ulcer of sacral region, stage 3: Secondary | ICD-10-CM | POA: Diagnosis not present

## 2016-08-28 DIAGNOSIS — R1310 Dysphagia, unspecified: Secondary | ICD-10-CM | POA: Diagnosis not present

## 2016-08-31 DIAGNOSIS — I10 Essential (primary) hypertension: Secondary | ICD-10-CM | POA: Diagnosis not present

## 2016-08-31 DIAGNOSIS — L89153 Pressure ulcer of sacral region, stage 3: Secondary | ICD-10-CM | POA: Diagnosis not present

## 2016-08-31 DIAGNOSIS — I69351 Hemiplegia and hemiparesis following cerebral infarction affecting right dominant side: Secondary | ICD-10-CM | POA: Diagnosis not present

## 2016-08-31 DIAGNOSIS — I69391 Dysphagia following cerebral infarction: Secondary | ICD-10-CM | POA: Diagnosis not present

## 2016-08-31 DIAGNOSIS — R1310 Dysphagia, unspecified: Secondary | ICD-10-CM | POA: Diagnosis not present

## 2016-08-31 DIAGNOSIS — L89310 Pressure ulcer of right buttock, unstageable: Secondary | ICD-10-CM | POA: Diagnosis not present

## 2016-09-02 DIAGNOSIS — L89310 Pressure ulcer of right buttock, unstageable: Secondary | ICD-10-CM | POA: Diagnosis not present

## 2016-09-02 DIAGNOSIS — I69351 Hemiplegia and hemiparesis following cerebral infarction affecting right dominant side: Secondary | ICD-10-CM | POA: Diagnosis not present

## 2016-09-02 DIAGNOSIS — L89153 Pressure ulcer of sacral region, stage 3: Secondary | ICD-10-CM | POA: Diagnosis not present

## 2016-09-02 DIAGNOSIS — I69391 Dysphagia following cerebral infarction: Secondary | ICD-10-CM | POA: Diagnosis not present

## 2016-09-02 DIAGNOSIS — R1310 Dysphagia, unspecified: Secondary | ICD-10-CM | POA: Diagnosis not present

## 2016-09-02 DIAGNOSIS — I10 Essential (primary) hypertension: Secondary | ICD-10-CM | POA: Diagnosis not present

## 2016-09-04 DIAGNOSIS — L89153 Pressure ulcer of sacral region, stage 3: Secondary | ICD-10-CM | POA: Diagnosis not present

## 2016-09-04 DIAGNOSIS — I69391 Dysphagia following cerebral infarction: Secondary | ICD-10-CM | POA: Diagnosis not present

## 2016-09-04 DIAGNOSIS — I69351 Hemiplegia and hemiparesis following cerebral infarction affecting right dominant side: Secondary | ICD-10-CM | POA: Diagnosis not present

## 2016-09-04 DIAGNOSIS — L89310 Pressure ulcer of right buttock, unstageable: Secondary | ICD-10-CM | POA: Diagnosis not present

## 2016-09-04 DIAGNOSIS — R1310 Dysphagia, unspecified: Secondary | ICD-10-CM | POA: Diagnosis not present

## 2016-09-04 DIAGNOSIS — I10 Essential (primary) hypertension: Secondary | ICD-10-CM | POA: Diagnosis not present

## 2016-09-07 DIAGNOSIS — I69351 Hemiplegia and hemiparesis following cerebral infarction affecting right dominant side: Secondary | ICD-10-CM | POA: Diagnosis not present

## 2016-09-07 DIAGNOSIS — L89153 Pressure ulcer of sacral region, stage 3: Secondary | ICD-10-CM | POA: Diagnosis not present

## 2016-09-07 DIAGNOSIS — I10 Essential (primary) hypertension: Secondary | ICD-10-CM | POA: Diagnosis not present

## 2016-09-07 DIAGNOSIS — R1310 Dysphagia, unspecified: Secondary | ICD-10-CM | POA: Diagnosis not present

## 2016-09-07 DIAGNOSIS — L89310 Pressure ulcer of right buttock, unstageable: Secondary | ICD-10-CM | POA: Diagnosis not present

## 2016-09-07 DIAGNOSIS — I69391 Dysphagia following cerebral infarction: Secondary | ICD-10-CM | POA: Diagnosis not present

## 2016-09-09 DIAGNOSIS — L89153 Pressure ulcer of sacral region, stage 3: Secondary | ICD-10-CM | POA: Diagnosis not present

## 2016-09-09 DIAGNOSIS — I10 Essential (primary) hypertension: Secondary | ICD-10-CM | POA: Diagnosis not present

## 2016-09-09 DIAGNOSIS — I69391 Dysphagia following cerebral infarction: Secondary | ICD-10-CM | POA: Diagnosis not present

## 2016-09-09 DIAGNOSIS — I69351 Hemiplegia and hemiparesis following cerebral infarction affecting right dominant side: Secondary | ICD-10-CM | POA: Diagnosis not present

## 2016-09-09 DIAGNOSIS — L89310 Pressure ulcer of right buttock, unstageable: Secondary | ICD-10-CM | POA: Diagnosis not present

## 2016-09-09 DIAGNOSIS — R1310 Dysphagia, unspecified: Secondary | ICD-10-CM | POA: Diagnosis not present

## 2016-09-11 DIAGNOSIS — I10 Essential (primary) hypertension: Secondary | ICD-10-CM | POA: Diagnosis not present

## 2016-09-11 DIAGNOSIS — L89310 Pressure ulcer of right buttock, unstageable: Secondary | ICD-10-CM | POA: Diagnosis not present

## 2016-09-11 DIAGNOSIS — R1310 Dysphagia, unspecified: Secondary | ICD-10-CM | POA: Diagnosis not present

## 2016-09-11 DIAGNOSIS — I69391 Dysphagia following cerebral infarction: Secondary | ICD-10-CM | POA: Diagnosis not present

## 2016-09-11 DIAGNOSIS — I69351 Hemiplegia and hemiparesis following cerebral infarction affecting right dominant side: Secondary | ICD-10-CM | POA: Diagnosis not present

## 2016-09-11 DIAGNOSIS — L89153 Pressure ulcer of sacral region, stage 3: Secondary | ICD-10-CM | POA: Diagnosis not present

## 2016-09-16 DIAGNOSIS — I69351 Hemiplegia and hemiparesis following cerebral infarction affecting right dominant side: Secondary | ICD-10-CM | POA: Diagnosis not present

## 2016-09-16 DIAGNOSIS — L89310 Pressure ulcer of right buttock, unstageable: Secondary | ICD-10-CM | POA: Diagnosis not present

## 2016-09-16 DIAGNOSIS — L89153 Pressure ulcer of sacral region, stage 3: Secondary | ICD-10-CM | POA: Diagnosis not present

## 2016-09-16 DIAGNOSIS — I10 Essential (primary) hypertension: Secondary | ICD-10-CM | POA: Diagnosis not present

## 2016-09-16 DIAGNOSIS — I69391 Dysphagia following cerebral infarction: Secondary | ICD-10-CM | POA: Diagnosis not present

## 2016-09-16 DIAGNOSIS — R1310 Dysphagia, unspecified: Secondary | ICD-10-CM | POA: Diagnosis not present

## 2016-09-18 DIAGNOSIS — L89310 Pressure ulcer of right buttock, unstageable: Secondary | ICD-10-CM | POA: Diagnosis not present

## 2016-09-18 DIAGNOSIS — I69351 Hemiplegia and hemiparesis following cerebral infarction affecting right dominant side: Secondary | ICD-10-CM | POA: Diagnosis not present

## 2016-09-18 DIAGNOSIS — I10 Essential (primary) hypertension: Secondary | ICD-10-CM | POA: Diagnosis not present

## 2016-09-18 DIAGNOSIS — I69391 Dysphagia following cerebral infarction: Secondary | ICD-10-CM | POA: Diagnosis not present

## 2016-09-18 DIAGNOSIS — L89153 Pressure ulcer of sacral region, stage 3: Secondary | ICD-10-CM | POA: Diagnosis not present

## 2016-09-18 DIAGNOSIS — R1310 Dysphagia, unspecified: Secondary | ICD-10-CM | POA: Diagnosis not present

## 2016-09-21 DIAGNOSIS — I69391 Dysphagia following cerebral infarction: Secondary | ICD-10-CM | POA: Diagnosis not present

## 2016-09-21 DIAGNOSIS — R1310 Dysphagia, unspecified: Secondary | ICD-10-CM | POA: Diagnosis not present

## 2016-09-21 DIAGNOSIS — L89310 Pressure ulcer of right buttock, unstageable: Secondary | ICD-10-CM | POA: Diagnosis not present

## 2016-09-21 DIAGNOSIS — I69351 Hemiplegia and hemiparesis following cerebral infarction affecting right dominant side: Secondary | ICD-10-CM | POA: Diagnosis not present

## 2016-09-21 DIAGNOSIS — I10 Essential (primary) hypertension: Secondary | ICD-10-CM | POA: Diagnosis not present

## 2016-09-21 DIAGNOSIS — L89153 Pressure ulcer of sacral region, stage 3: Secondary | ICD-10-CM | POA: Diagnosis not present

## 2016-09-22 DIAGNOSIS — L89153 Pressure ulcer of sacral region, stage 3: Secondary | ICD-10-CM | POA: Diagnosis not present

## 2016-09-22 DIAGNOSIS — R1310 Dysphagia, unspecified: Secondary | ICD-10-CM | POA: Diagnosis not present

## 2016-09-22 DIAGNOSIS — I69351 Hemiplegia and hemiparesis following cerebral infarction affecting right dominant side: Secondary | ICD-10-CM | POA: Diagnosis not present

## 2016-09-22 DIAGNOSIS — I10 Essential (primary) hypertension: Secondary | ICD-10-CM | POA: Diagnosis not present

## 2016-09-22 DIAGNOSIS — L89310 Pressure ulcer of right buttock, unstageable: Secondary | ICD-10-CM | POA: Diagnosis not present

## 2016-09-22 DIAGNOSIS — I69391 Dysphagia following cerebral infarction: Secondary | ICD-10-CM | POA: Diagnosis not present

## 2016-09-23 DIAGNOSIS — L89310 Pressure ulcer of right buttock, unstageable: Secondary | ICD-10-CM | POA: Diagnosis not present

## 2016-09-23 DIAGNOSIS — I69351 Hemiplegia and hemiparesis following cerebral infarction affecting right dominant side: Secondary | ICD-10-CM | POA: Diagnosis not present

## 2016-09-23 DIAGNOSIS — L89153 Pressure ulcer of sacral region, stage 3: Secondary | ICD-10-CM | POA: Diagnosis not present

## 2016-09-23 DIAGNOSIS — I69391 Dysphagia following cerebral infarction: Secondary | ICD-10-CM | POA: Diagnosis not present

## 2016-09-23 DIAGNOSIS — R1310 Dysphagia, unspecified: Secondary | ICD-10-CM | POA: Diagnosis not present

## 2016-09-23 DIAGNOSIS — I10 Essential (primary) hypertension: Secondary | ICD-10-CM | POA: Diagnosis not present

## 2016-09-25 DIAGNOSIS — I69391 Dysphagia following cerebral infarction: Secondary | ICD-10-CM | POA: Diagnosis not present

## 2016-09-25 DIAGNOSIS — I69351 Hemiplegia and hemiparesis following cerebral infarction affecting right dominant side: Secondary | ICD-10-CM | POA: Diagnosis not present

## 2016-09-25 DIAGNOSIS — L89153 Pressure ulcer of sacral region, stage 3: Secondary | ICD-10-CM | POA: Diagnosis not present

## 2016-09-25 DIAGNOSIS — R1310 Dysphagia, unspecified: Secondary | ICD-10-CM | POA: Diagnosis not present

## 2016-09-25 DIAGNOSIS — L89310 Pressure ulcer of right buttock, unstageable: Secondary | ICD-10-CM | POA: Diagnosis not present

## 2016-09-25 DIAGNOSIS — I10 Essential (primary) hypertension: Secondary | ICD-10-CM | POA: Diagnosis not present

## 2016-09-28 DIAGNOSIS — L89310 Pressure ulcer of right buttock, unstageable: Secondary | ICD-10-CM | POA: Diagnosis not present

## 2016-09-28 DIAGNOSIS — R1310 Dysphagia, unspecified: Secondary | ICD-10-CM | POA: Diagnosis not present

## 2016-09-28 DIAGNOSIS — I10 Essential (primary) hypertension: Secondary | ICD-10-CM | POA: Diagnosis not present

## 2016-09-28 DIAGNOSIS — I69351 Hemiplegia and hemiparesis following cerebral infarction affecting right dominant side: Secondary | ICD-10-CM | POA: Diagnosis not present

## 2016-09-28 DIAGNOSIS — L89153 Pressure ulcer of sacral region, stage 3: Secondary | ICD-10-CM | POA: Diagnosis not present

## 2016-09-28 DIAGNOSIS — I69391 Dysphagia following cerebral infarction: Secondary | ICD-10-CM | POA: Diagnosis not present

## 2016-09-29 DIAGNOSIS — I10 Essential (primary) hypertension: Secondary | ICD-10-CM | POA: Diagnosis not present

## 2016-09-29 DIAGNOSIS — R1310 Dysphagia, unspecified: Secondary | ICD-10-CM | POA: Diagnosis not present

## 2016-09-29 DIAGNOSIS — L89310 Pressure ulcer of right buttock, unstageable: Secondary | ICD-10-CM | POA: Diagnosis not present

## 2016-09-29 DIAGNOSIS — I69351 Hemiplegia and hemiparesis following cerebral infarction affecting right dominant side: Secondary | ICD-10-CM | POA: Diagnosis not present

## 2016-09-29 DIAGNOSIS — L89153 Pressure ulcer of sacral region, stage 3: Secondary | ICD-10-CM | POA: Diagnosis not present

## 2016-09-29 DIAGNOSIS — I69391 Dysphagia following cerebral infarction: Secondary | ICD-10-CM | POA: Diagnosis not present

## 2016-09-30 DIAGNOSIS — I69391 Dysphagia following cerebral infarction: Secondary | ICD-10-CM | POA: Diagnosis not present

## 2016-09-30 DIAGNOSIS — R1310 Dysphagia, unspecified: Secondary | ICD-10-CM | POA: Diagnosis not present

## 2016-09-30 DIAGNOSIS — I10 Essential (primary) hypertension: Secondary | ICD-10-CM | POA: Diagnosis not present

## 2016-09-30 DIAGNOSIS — I69351 Hemiplegia and hemiparesis following cerebral infarction affecting right dominant side: Secondary | ICD-10-CM | POA: Diagnosis not present

## 2016-09-30 DIAGNOSIS — L89310 Pressure ulcer of right buttock, unstageable: Secondary | ICD-10-CM | POA: Diagnosis not present

## 2016-09-30 DIAGNOSIS — L89153 Pressure ulcer of sacral region, stage 3: Secondary | ICD-10-CM | POA: Diagnosis not present

## 2016-10-01 DIAGNOSIS — L89153 Pressure ulcer of sacral region, stage 3: Secondary | ICD-10-CM | POA: Diagnosis not present

## 2016-10-01 DIAGNOSIS — L89310 Pressure ulcer of right buttock, unstageable: Secondary | ICD-10-CM | POA: Diagnosis not present

## 2016-10-01 DIAGNOSIS — R1310 Dysphagia, unspecified: Secondary | ICD-10-CM | POA: Diagnosis not present

## 2016-10-01 DIAGNOSIS — I69351 Hemiplegia and hemiparesis following cerebral infarction affecting right dominant side: Secondary | ICD-10-CM | POA: Diagnosis not present

## 2016-10-01 DIAGNOSIS — I10 Essential (primary) hypertension: Secondary | ICD-10-CM | POA: Diagnosis not present

## 2016-10-01 DIAGNOSIS — I69391 Dysphagia following cerebral infarction: Secondary | ICD-10-CM | POA: Diagnosis not present

## 2016-10-02 DIAGNOSIS — R1310 Dysphagia, unspecified: Secondary | ICD-10-CM | POA: Diagnosis not present

## 2016-10-02 DIAGNOSIS — L89153 Pressure ulcer of sacral region, stage 3: Secondary | ICD-10-CM | POA: Diagnosis not present

## 2016-10-02 DIAGNOSIS — I69391 Dysphagia following cerebral infarction: Secondary | ICD-10-CM | POA: Diagnosis not present

## 2016-10-02 DIAGNOSIS — I69351 Hemiplegia and hemiparesis following cerebral infarction affecting right dominant side: Secondary | ICD-10-CM | POA: Diagnosis not present

## 2016-10-02 DIAGNOSIS — I10 Essential (primary) hypertension: Secondary | ICD-10-CM | POA: Diagnosis not present

## 2016-10-02 DIAGNOSIS — L89310 Pressure ulcer of right buttock, unstageable: Secondary | ICD-10-CM | POA: Diagnosis not present

## 2016-10-05 DIAGNOSIS — I10 Essential (primary) hypertension: Secondary | ICD-10-CM | POA: Diagnosis not present

## 2016-10-05 DIAGNOSIS — I69351 Hemiplegia and hemiparesis following cerebral infarction affecting right dominant side: Secondary | ICD-10-CM | POA: Diagnosis not present

## 2016-10-05 DIAGNOSIS — L89153 Pressure ulcer of sacral region, stage 3: Secondary | ICD-10-CM | POA: Diagnosis not present

## 2016-10-05 DIAGNOSIS — R1310 Dysphagia, unspecified: Secondary | ICD-10-CM | POA: Diagnosis not present

## 2016-10-05 DIAGNOSIS — I69391 Dysphagia following cerebral infarction: Secondary | ICD-10-CM | POA: Diagnosis not present

## 2016-10-05 DIAGNOSIS — L89310 Pressure ulcer of right buttock, unstageable: Secondary | ICD-10-CM | POA: Diagnosis not present

## 2016-10-07 DIAGNOSIS — R1310 Dysphagia, unspecified: Secondary | ICD-10-CM | POA: Diagnosis not present

## 2016-10-07 DIAGNOSIS — I10 Essential (primary) hypertension: Secondary | ICD-10-CM | POA: Diagnosis not present

## 2016-10-07 DIAGNOSIS — L89310 Pressure ulcer of right buttock, unstageable: Secondary | ICD-10-CM | POA: Diagnosis not present

## 2016-10-07 DIAGNOSIS — L89153 Pressure ulcer of sacral region, stage 3: Secondary | ICD-10-CM | POA: Diagnosis not present

## 2016-10-07 DIAGNOSIS — I69391 Dysphagia following cerebral infarction: Secondary | ICD-10-CM | POA: Diagnosis not present

## 2016-10-07 DIAGNOSIS — I69351 Hemiplegia and hemiparesis following cerebral infarction affecting right dominant side: Secondary | ICD-10-CM | POA: Diagnosis not present

## 2016-10-09 DIAGNOSIS — I69351 Hemiplegia and hemiparesis following cerebral infarction affecting right dominant side: Secondary | ICD-10-CM | POA: Diagnosis not present

## 2016-10-09 DIAGNOSIS — I10 Essential (primary) hypertension: Secondary | ICD-10-CM | POA: Diagnosis not present

## 2016-10-09 DIAGNOSIS — I69391 Dysphagia following cerebral infarction: Secondary | ICD-10-CM | POA: Diagnosis not present

## 2016-10-09 DIAGNOSIS — R1310 Dysphagia, unspecified: Secondary | ICD-10-CM | POA: Diagnosis not present

## 2016-10-09 DIAGNOSIS — L89310 Pressure ulcer of right buttock, unstageable: Secondary | ICD-10-CM | POA: Diagnosis not present

## 2016-10-09 DIAGNOSIS — L89153 Pressure ulcer of sacral region, stage 3: Secondary | ICD-10-CM | POA: Diagnosis not present

## 2016-10-12 ENCOUNTER — Encounter (HOSPITAL_BASED_OUTPATIENT_CLINIC_OR_DEPARTMENT_OTHER): Payer: Medicare Other | Attending: Internal Medicine

## 2016-10-12 DIAGNOSIS — M24542 Contracture, left hand: Secondary | ICD-10-CM | POA: Diagnosis not present

## 2016-10-12 DIAGNOSIS — I10 Essential (primary) hypertension: Secondary | ICD-10-CM | POA: Diagnosis not present

## 2016-10-12 DIAGNOSIS — L97521 Non-pressure chronic ulcer of other part of left foot limited to breakdown of skin: Secondary | ICD-10-CM | POA: Insufficient documentation

## 2016-10-12 DIAGNOSIS — L03032 Cellulitis of left toe: Secondary | ICD-10-CM | POA: Diagnosis not present

## 2016-10-12 DIAGNOSIS — L89892 Pressure ulcer of other site, stage 2: Secondary | ICD-10-CM | POA: Insufficient documentation

## 2016-10-12 DIAGNOSIS — Z8673 Personal history of transient ischemic attack (TIA), and cerebral infarction without residual deficits: Secondary | ICD-10-CM | POA: Diagnosis not present

## 2016-10-12 DIAGNOSIS — S91301A Unspecified open wound, right foot, initial encounter: Secondary | ICD-10-CM | POA: Diagnosis not present

## 2016-10-12 DIAGNOSIS — L928 Other granulomatous disorders of the skin and subcutaneous tissue: Secondary | ICD-10-CM | POA: Diagnosis not present

## 2016-10-12 DIAGNOSIS — L89154 Pressure ulcer of sacral region, stage 4: Secondary | ICD-10-CM | POA: Diagnosis not present

## 2016-10-12 DIAGNOSIS — S91102A Unspecified open wound of left great toe without damage to nail, initial encounter: Secondary | ICD-10-CM | POA: Diagnosis not present

## 2016-10-12 DIAGNOSIS — M24541 Contracture, right hand: Secondary | ICD-10-CM | POA: Diagnosis not present

## 2016-10-12 DIAGNOSIS — L89153 Pressure ulcer of sacral region, stage 3: Secondary | ICD-10-CM | POA: Diagnosis not present

## 2016-10-12 DIAGNOSIS — Z931 Gastrostomy status: Secondary | ICD-10-CM | POA: Diagnosis not present

## 2016-10-12 DIAGNOSIS — I6930 Unspecified sequelae of cerebral infarction: Secondary | ICD-10-CM | POA: Diagnosis not present

## 2016-10-14 DIAGNOSIS — R1310 Dysphagia, unspecified: Secondary | ICD-10-CM | POA: Diagnosis not present

## 2016-10-14 DIAGNOSIS — I10 Essential (primary) hypertension: Secondary | ICD-10-CM | POA: Diagnosis not present

## 2016-10-14 DIAGNOSIS — L89153 Pressure ulcer of sacral region, stage 3: Secondary | ICD-10-CM | POA: Diagnosis not present

## 2016-10-14 DIAGNOSIS — I69391 Dysphagia following cerebral infarction: Secondary | ICD-10-CM | POA: Diagnosis not present

## 2016-10-14 DIAGNOSIS — L89310 Pressure ulcer of right buttock, unstageable: Secondary | ICD-10-CM | POA: Diagnosis not present

## 2016-10-14 DIAGNOSIS — I69351 Hemiplegia and hemiparesis following cerebral infarction affecting right dominant side: Secondary | ICD-10-CM | POA: Diagnosis not present

## 2016-10-15 DIAGNOSIS — M533 Sacrococcygeal disorders, not elsewhere classified: Secondary | ICD-10-CM | POA: Diagnosis not present

## 2016-10-15 LAB — AEROBIC CULTURE W GRAM STAIN (SUPERFICIAL SPECIMEN)

## 2016-10-15 LAB — AEROBIC CULTURE  (SUPERFICIAL SPECIMEN)

## 2016-10-16 DIAGNOSIS — R1310 Dysphagia, unspecified: Secondary | ICD-10-CM | POA: Diagnosis not present

## 2016-10-16 DIAGNOSIS — L89153 Pressure ulcer of sacral region, stage 3: Secondary | ICD-10-CM | POA: Diagnosis not present

## 2016-10-16 DIAGNOSIS — I69391 Dysphagia following cerebral infarction: Secondary | ICD-10-CM | POA: Diagnosis not present

## 2016-10-16 DIAGNOSIS — I10 Essential (primary) hypertension: Secondary | ICD-10-CM | POA: Diagnosis not present

## 2016-10-16 DIAGNOSIS — L89310 Pressure ulcer of right buttock, unstageable: Secondary | ICD-10-CM | POA: Diagnosis not present

## 2016-10-16 DIAGNOSIS — I69351 Hemiplegia and hemiparesis following cerebral infarction affecting right dominant side: Secondary | ICD-10-CM | POA: Diagnosis not present

## 2016-10-21 DIAGNOSIS — L89153 Pressure ulcer of sacral region, stage 3: Secondary | ICD-10-CM | POA: Diagnosis not present

## 2016-10-21 DIAGNOSIS — I69391 Dysphagia following cerebral infarction: Secondary | ICD-10-CM | POA: Diagnosis not present

## 2016-10-21 DIAGNOSIS — L89310 Pressure ulcer of right buttock, unstageable: Secondary | ICD-10-CM | POA: Diagnosis not present

## 2016-10-21 DIAGNOSIS — I10 Essential (primary) hypertension: Secondary | ICD-10-CM | POA: Diagnosis not present

## 2016-10-21 DIAGNOSIS — R1310 Dysphagia, unspecified: Secondary | ICD-10-CM | POA: Diagnosis not present

## 2016-10-21 DIAGNOSIS — I69351 Hemiplegia and hemiparesis following cerebral infarction affecting right dominant side: Secondary | ICD-10-CM | POA: Diagnosis not present

## 2016-10-23 DIAGNOSIS — L89153 Pressure ulcer of sacral region, stage 3: Secondary | ICD-10-CM | POA: Diagnosis not present

## 2016-10-23 DIAGNOSIS — L89310 Pressure ulcer of right buttock, unstageable: Secondary | ICD-10-CM | POA: Diagnosis not present

## 2016-10-23 DIAGNOSIS — I69391 Dysphagia following cerebral infarction: Secondary | ICD-10-CM | POA: Diagnosis not present

## 2016-10-23 DIAGNOSIS — I10 Essential (primary) hypertension: Secondary | ICD-10-CM | POA: Diagnosis not present

## 2016-10-23 DIAGNOSIS — I69351 Hemiplegia and hemiparesis following cerebral infarction affecting right dominant side: Secondary | ICD-10-CM | POA: Diagnosis not present

## 2016-10-23 DIAGNOSIS — R1310 Dysphagia, unspecified: Secondary | ICD-10-CM | POA: Diagnosis not present

## 2016-10-26 DIAGNOSIS — L89154 Pressure ulcer of sacral region, stage 4: Secondary | ICD-10-CM | POA: Diagnosis not present

## 2016-10-26 DIAGNOSIS — L89892 Pressure ulcer of other site, stage 2: Secondary | ICD-10-CM | POA: Diagnosis not present

## 2016-10-26 DIAGNOSIS — Z931 Gastrostomy status: Secondary | ICD-10-CM | POA: Diagnosis not present

## 2016-10-26 DIAGNOSIS — S31000A Unspecified open wound of lower back and pelvis without penetration into retroperitoneum, initial encounter: Secondary | ICD-10-CM | POA: Diagnosis not present

## 2016-10-26 DIAGNOSIS — Z8673 Personal history of transient ischemic attack (TIA), and cerebral infarction without residual deficits: Secondary | ICD-10-CM | POA: Diagnosis not present

## 2016-10-26 DIAGNOSIS — L928 Other granulomatous disorders of the skin and subcutaneous tissue: Secondary | ICD-10-CM | POA: Diagnosis not present

## 2016-10-26 DIAGNOSIS — S91301A Unspecified open wound, right foot, initial encounter: Secondary | ICD-10-CM | POA: Diagnosis not present

## 2016-10-26 DIAGNOSIS — L97521 Non-pressure chronic ulcer of other part of left foot limited to breakdown of skin: Secondary | ICD-10-CM | POA: Diagnosis not present

## 2016-10-26 DIAGNOSIS — L03032 Cellulitis of left toe: Secondary | ICD-10-CM | POA: Diagnosis not present

## 2016-10-26 DIAGNOSIS — S91102A Unspecified open wound of left great toe without damage to nail, initial encounter: Secondary | ICD-10-CM | POA: Diagnosis not present

## 2016-10-27 DIAGNOSIS — I69351 Hemiplegia and hemiparesis following cerebral infarction affecting right dominant side: Secondary | ICD-10-CM | POA: Diagnosis not present

## 2016-10-27 DIAGNOSIS — L89310 Pressure ulcer of right buttock, unstageable: Secondary | ICD-10-CM | POA: Diagnosis not present

## 2016-10-27 DIAGNOSIS — I69391 Dysphagia following cerebral infarction: Secondary | ICD-10-CM | POA: Diagnosis not present

## 2016-10-27 DIAGNOSIS — L89153 Pressure ulcer of sacral region, stage 3: Secondary | ICD-10-CM | POA: Diagnosis not present

## 2016-10-27 DIAGNOSIS — R1310 Dysphagia, unspecified: Secondary | ICD-10-CM | POA: Diagnosis not present

## 2016-10-27 DIAGNOSIS — I10 Essential (primary) hypertension: Secondary | ICD-10-CM | POA: Diagnosis not present

## 2016-10-28 DIAGNOSIS — I10 Essential (primary) hypertension: Secondary | ICD-10-CM | POA: Diagnosis not present

## 2016-10-28 DIAGNOSIS — I69351 Hemiplegia and hemiparesis following cerebral infarction affecting right dominant side: Secondary | ICD-10-CM | POA: Diagnosis not present

## 2016-10-28 DIAGNOSIS — L89153 Pressure ulcer of sacral region, stage 3: Secondary | ICD-10-CM | POA: Diagnosis not present

## 2016-10-28 DIAGNOSIS — I69391 Dysphagia following cerebral infarction: Secondary | ICD-10-CM | POA: Diagnosis not present

## 2016-10-28 DIAGNOSIS — R1310 Dysphagia, unspecified: Secondary | ICD-10-CM | POA: Diagnosis not present

## 2016-10-28 DIAGNOSIS — L89310 Pressure ulcer of right buttock, unstageable: Secondary | ICD-10-CM | POA: Diagnosis not present

## 2016-10-30 DIAGNOSIS — L89612 Pressure ulcer of right heel, stage 2: Secondary | ICD-10-CM | POA: Diagnosis present

## 2016-10-30 DIAGNOSIS — Z931 Gastrostomy status: Secondary | ICD-10-CM | POA: Diagnosis not present

## 2016-10-30 DIAGNOSIS — R6521 Severe sepsis with septic shock: Secondary | ICD-10-CM | POA: Diagnosis not present

## 2016-10-30 DIAGNOSIS — L89892 Pressure ulcer of other site, stage 2: Secondary | ICD-10-CM | POA: Diagnosis not present

## 2016-10-30 DIAGNOSIS — R627 Adult failure to thrive: Secondary | ICD-10-CM | POA: Diagnosis present

## 2016-10-30 DIAGNOSIS — R918 Other nonspecific abnormal finding of lung field: Secondary | ICD-10-CM | POA: Diagnosis not present

## 2016-10-30 DIAGNOSIS — K2970 Gastritis, unspecified, without bleeding: Secondary | ICD-10-CM | POA: Diagnosis present

## 2016-10-30 DIAGNOSIS — Z86718 Personal history of other venous thrombosis and embolism: Secondary | ICD-10-CM | POA: Diagnosis not present

## 2016-10-30 DIAGNOSIS — I10 Essential (primary) hypertension: Secondary | ICD-10-CM | POA: Diagnosis present

## 2016-10-30 DIAGNOSIS — K920 Hematemesis: Secondary | ICD-10-CM | POA: Diagnosis not present

## 2016-10-30 DIAGNOSIS — R6510 Systemic inflammatory response syndrome (SIRS) of non-infectious origin without acute organ dysfunction: Secondary | ICD-10-CM | POA: Diagnosis not present

## 2016-10-30 DIAGNOSIS — R05 Cough: Secondary | ICD-10-CM | POA: Diagnosis not present

## 2016-10-30 DIAGNOSIS — T364X5A Adverse effect of tetracyclines, initial encounter: Secondary | ICD-10-CM | POA: Diagnosis present

## 2016-10-30 DIAGNOSIS — M8580 Other specified disorders of bone density and structure, unspecified site: Secondary | ICD-10-CM | POA: Diagnosis not present

## 2016-10-30 DIAGNOSIS — R636 Underweight: Secondary | ICD-10-CM | POA: Diagnosis present

## 2016-10-30 DIAGNOSIS — Z7982 Long term (current) use of aspirin: Secondary | ICD-10-CM | POA: Diagnosis not present

## 2016-10-30 DIAGNOSIS — Z8673 Personal history of transient ischemic attack (TIA), and cerebral infarction without residual deficits: Secondary | ICD-10-CM | POA: Diagnosis not present

## 2016-10-30 DIAGNOSIS — L89153 Pressure ulcer of sacral region, stage 3: Secondary | ICD-10-CM | POA: Diagnosis present

## 2016-10-30 DIAGNOSIS — E872 Acidosis: Secondary | ICD-10-CM | POA: Diagnosis not present

## 2016-10-30 DIAGNOSIS — L97511 Non-pressure chronic ulcer of other part of right foot limited to breakdown of skin: Secondary | ICD-10-CM | POA: Diagnosis not present

## 2016-10-30 DIAGNOSIS — Z9221 Personal history of antineoplastic chemotherapy: Secondary | ICD-10-CM | POA: Diagnosis not present

## 2016-10-30 DIAGNOSIS — Z85038 Personal history of other malignant neoplasm of large intestine: Secondary | ICD-10-CM | POA: Diagnosis not present

## 2016-10-30 DIAGNOSIS — L89893 Pressure ulcer of other site, stage 3: Secondary | ICD-10-CM | POA: Diagnosis not present

## 2016-10-30 DIAGNOSIS — I959 Hypotension, unspecified: Secondary | ICD-10-CM | POA: Diagnosis not present

## 2016-10-30 DIAGNOSIS — I517 Cardiomegaly: Secondary | ICD-10-CM | POA: Diagnosis not present

## 2016-10-30 DIAGNOSIS — E874 Mixed disorder of acid-base balance: Secondary | ICD-10-CM | POA: Diagnosis present

## 2016-10-30 DIAGNOSIS — K922 Gastrointestinal hemorrhage, unspecified: Secondary | ICD-10-CM | POA: Diagnosis not present

## 2016-10-30 DIAGNOSIS — K226 Gastro-esophageal laceration-hemorrhage syndrome: Secondary | ICD-10-CM | POA: Diagnosis not present

## 2016-10-30 DIAGNOSIS — G40209 Localization-related (focal) (partial) symptomatic epilepsy and epileptic syndromes with complex partial seizures, not intractable, without status epilepticus: Secondary | ICD-10-CM | POA: Diagnosis present

## 2016-10-30 DIAGNOSIS — K5641 Fecal impaction: Secondary | ICD-10-CM | POA: Diagnosis present

## 2016-10-30 DIAGNOSIS — L89154 Pressure ulcer of sacral region, stage 4: Secondary | ICD-10-CM | POA: Diagnosis not present

## 2016-10-30 DIAGNOSIS — I444 Left anterior fascicular block: Secondary | ICD-10-CM | POA: Diagnosis not present

## 2016-10-30 DIAGNOSIS — M19071 Primary osteoarthritis, right ankle and foot: Secondary | ICD-10-CM | POA: Diagnosis not present

## 2016-10-30 DIAGNOSIS — C189 Malignant neoplasm of colon, unspecified: Secondary | ICD-10-CM | POA: Diagnosis not present

## 2016-10-30 DIAGNOSIS — R0902 Hypoxemia: Secondary | ICD-10-CM | POA: Diagnosis not present

## 2016-10-30 DIAGNOSIS — L97512 Non-pressure chronic ulcer of other part of right foot with fat layer exposed: Secondary | ICD-10-CM | POA: Diagnosis not present

## 2016-10-30 DIAGNOSIS — Z681 Body mass index (BMI) 19 or less, adult: Secondary | ICD-10-CM | POA: Diagnosis not present

## 2016-10-30 DIAGNOSIS — D62 Acute posthemorrhagic anemia: Secondary | ICD-10-CM | POA: Diagnosis not present

## 2016-10-30 DIAGNOSIS — I4510 Unspecified right bundle-branch block: Secondary | ICD-10-CM | POA: Diagnosis present

## 2016-11-04 DIAGNOSIS — R1310 Dysphagia, unspecified: Secondary | ICD-10-CM | POA: Diagnosis not present

## 2016-11-04 DIAGNOSIS — I69391 Dysphagia following cerebral infarction: Secondary | ICD-10-CM | POA: Diagnosis not present

## 2016-11-04 DIAGNOSIS — I69351 Hemiplegia and hemiparesis following cerebral infarction affecting right dominant side: Secondary | ICD-10-CM | POA: Diagnosis not present

## 2016-11-04 DIAGNOSIS — L89153 Pressure ulcer of sacral region, stage 3: Secondary | ICD-10-CM | POA: Diagnosis not present

## 2016-11-04 DIAGNOSIS — I10 Essential (primary) hypertension: Secondary | ICD-10-CM | POA: Diagnosis not present

## 2016-11-04 DIAGNOSIS — L89310 Pressure ulcer of right buttock, unstageable: Secondary | ICD-10-CM | POA: Diagnosis not present

## 2016-11-06 DIAGNOSIS — I10 Essential (primary) hypertension: Secondary | ICD-10-CM | POA: Diagnosis not present

## 2016-11-06 DIAGNOSIS — L89153 Pressure ulcer of sacral region, stage 3: Secondary | ICD-10-CM | POA: Diagnosis not present

## 2016-11-06 DIAGNOSIS — R1310 Dysphagia, unspecified: Secondary | ICD-10-CM | POA: Diagnosis not present

## 2016-11-06 DIAGNOSIS — L89310 Pressure ulcer of right buttock, unstageable: Secondary | ICD-10-CM | POA: Diagnosis not present

## 2016-11-06 DIAGNOSIS — I69391 Dysphagia following cerebral infarction: Secondary | ICD-10-CM | POA: Diagnosis not present

## 2016-11-06 DIAGNOSIS — I69351 Hemiplegia and hemiparesis following cerebral infarction affecting right dominant side: Secondary | ICD-10-CM | POA: Diagnosis not present

## 2016-11-09 ENCOUNTER — Encounter (HOSPITAL_BASED_OUTPATIENT_CLINIC_OR_DEPARTMENT_OTHER): Payer: Medicare Other | Attending: Internal Medicine

## 2016-11-09 DIAGNOSIS — S91301A Unspecified open wound, right foot, initial encounter: Secondary | ICD-10-CM | POA: Diagnosis not present

## 2016-11-09 DIAGNOSIS — S31000A Unspecified open wound of lower back and pelvis without penetration into retroperitoneum, initial encounter: Secondary | ICD-10-CM | POA: Diagnosis not present

## 2016-11-09 DIAGNOSIS — L97515 Non-pressure chronic ulcer of other part of right foot with muscle involvement without evidence of necrosis: Secondary | ICD-10-CM | POA: Diagnosis not present

## 2016-11-09 DIAGNOSIS — L03032 Cellulitis of left toe: Secondary | ICD-10-CM | POA: Diagnosis not present

## 2016-11-09 DIAGNOSIS — L89154 Pressure ulcer of sacral region, stage 4: Secondary | ICD-10-CM | POA: Insufficient documentation

## 2016-11-09 DIAGNOSIS — S91102A Unspecified open wound of left great toe without damage to nail, initial encounter: Secondary | ICD-10-CM | POA: Diagnosis not present

## 2016-11-11 DIAGNOSIS — I69391 Dysphagia following cerebral infarction: Secondary | ICD-10-CM | POA: Diagnosis not present

## 2016-11-11 DIAGNOSIS — L89153 Pressure ulcer of sacral region, stage 3: Secondary | ICD-10-CM | POA: Diagnosis not present

## 2016-11-11 DIAGNOSIS — R1310 Dysphagia, unspecified: Secondary | ICD-10-CM | POA: Diagnosis not present

## 2016-11-11 DIAGNOSIS — I69351 Hemiplegia and hemiparesis following cerebral infarction affecting right dominant side: Secondary | ICD-10-CM | POA: Diagnosis not present

## 2016-11-11 DIAGNOSIS — I10 Essential (primary) hypertension: Secondary | ICD-10-CM | POA: Diagnosis not present

## 2016-11-11 DIAGNOSIS — L89310 Pressure ulcer of right buttock, unstageable: Secondary | ICD-10-CM | POA: Diagnosis not present

## 2016-11-13 DIAGNOSIS — R1310 Dysphagia, unspecified: Secondary | ICD-10-CM | POA: Diagnosis not present

## 2016-11-13 DIAGNOSIS — I69351 Hemiplegia and hemiparesis following cerebral infarction affecting right dominant side: Secondary | ICD-10-CM | POA: Diagnosis not present

## 2016-11-13 DIAGNOSIS — I69391 Dysphagia following cerebral infarction: Secondary | ICD-10-CM | POA: Diagnosis not present

## 2016-11-13 DIAGNOSIS — I10 Essential (primary) hypertension: Secondary | ICD-10-CM | POA: Diagnosis not present

## 2016-11-13 DIAGNOSIS — L89153 Pressure ulcer of sacral region, stage 3: Secondary | ICD-10-CM | POA: Diagnosis not present

## 2016-11-13 DIAGNOSIS — L89310 Pressure ulcer of right buttock, unstageable: Secondary | ICD-10-CM | POA: Diagnosis not present

## 2016-11-16 DIAGNOSIS — I69351 Hemiplegia and hemiparesis following cerebral infarction affecting right dominant side: Secondary | ICD-10-CM | POA: Diagnosis not present

## 2016-11-16 DIAGNOSIS — I10 Essential (primary) hypertension: Secondary | ICD-10-CM | POA: Diagnosis not present

## 2016-11-16 DIAGNOSIS — L89153 Pressure ulcer of sacral region, stage 3: Secondary | ICD-10-CM | POA: Diagnosis not present

## 2016-11-16 DIAGNOSIS — R1310 Dysphagia, unspecified: Secondary | ICD-10-CM | POA: Diagnosis not present

## 2016-11-16 DIAGNOSIS — L89310 Pressure ulcer of right buttock, unstageable: Secondary | ICD-10-CM | POA: Diagnosis not present

## 2016-11-16 DIAGNOSIS — I69391 Dysphagia following cerebral infarction: Secondary | ICD-10-CM | POA: Diagnosis not present

## 2016-11-18 DIAGNOSIS — I69391 Dysphagia following cerebral infarction: Secondary | ICD-10-CM | POA: Diagnosis not present

## 2016-11-18 DIAGNOSIS — L89153 Pressure ulcer of sacral region, stage 3: Secondary | ICD-10-CM | POA: Diagnosis not present

## 2016-11-18 DIAGNOSIS — I10 Essential (primary) hypertension: Secondary | ICD-10-CM | POA: Diagnosis not present

## 2016-11-18 DIAGNOSIS — R1310 Dysphagia, unspecified: Secondary | ICD-10-CM | POA: Diagnosis not present

## 2016-11-18 DIAGNOSIS — I69351 Hemiplegia and hemiparesis following cerebral infarction affecting right dominant side: Secondary | ICD-10-CM | POA: Diagnosis not present

## 2016-11-18 DIAGNOSIS — L89310 Pressure ulcer of right buttock, unstageable: Secondary | ICD-10-CM | POA: Diagnosis not present

## 2016-11-21 DIAGNOSIS — L89310 Pressure ulcer of right buttock, unstageable: Secondary | ICD-10-CM | POA: Diagnosis not present

## 2016-11-21 DIAGNOSIS — I69391 Dysphagia following cerebral infarction: Secondary | ICD-10-CM | POA: Diagnosis not present

## 2016-11-21 DIAGNOSIS — I69351 Hemiplegia and hemiparesis following cerebral infarction affecting right dominant side: Secondary | ICD-10-CM | POA: Diagnosis not present

## 2016-11-21 DIAGNOSIS — I10 Essential (primary) hypertension: Secondary | ICD-10-CM | POA: Diagnosis not present

## 2016-11-21 DIAGNOSIS — R1310 Dysphagia, unspecified: Secondary | ICD-10-CM | POA: Diagnosis not present

## 2016-11-21 DIAGNOSIS — L89153 Pressure ulcer of sacral region, stage 3: Secondary | ICD-10-CM | POA: Diagnosis not present

## 2016-11-23 DIAGNOSIS — L03032 Cellulitis of left toe: Secondary | ICD-10-CM | POA: Diagnosis not present

## 2016-11-23 DIAGNOSIS — L97515 Non-pressure chronic ulcer of other part of right foot with muscle involvement without evidence of necrosis: Secondary | ICD-10-CM | POA: Diagnosis not present

## 2016-11-23 DIAGNOSIS — L89154 Pressure ulcer of sacral region, stage 4: Secondary | ICD-10-CM | POA: Diagnosis not present

## 2016-11-23 DIAGNOSIS — L89892 Pressure ulcer of other site, stage 2: Secondary | ICD-10-CM | POA: Diagnosis not present

## 2016-11-25 DIAGNOSIS — L89310 Pressure ulcer of right buttock, unstageable: Secondary | ICD-10-CM | POA: Diagnosis not present

## 2016-11-25 DIAGNOSIS — I69351 Hemiplegia and hemiparesis following cerebral infarction affecting right dominant side: Secondary | ICD-10-CM | POA: Diagnosis not present

## 2016-11-25 DIAGNOSIS — I69391 Dysphagia following cerebral infarction: Secondary | ICD-10-CM | POA: Diagnosis not present

## 2016-11-25 DIAGNOSIS — I10 Essential (primary) hypertension: Secondary | ICD-10-CM | POA: Diagnosis not present

## 2016-11-25 DIAGNOSIS — R1310 Dysphagia, unspecified: Secondary | ICD-10-CM | POA: Diagnosis not present

## 2016-11-25 DIAGNOSIS — L89153 Pressure ulcer of sacral region, stage 3: Secondary | ICD-10-CM | POA: Diagnosis not present

## 2016-11-27 DIAGNOSIS — L89310 Pressure ulcer of right buttock, unstageable: Secondary | ICD-10-CM | POA: Diagnosis not present

## 2016-11-27 DIAGNOSIS — I69391 Dysphagia following cerebral infarction: Secondary | ICD-10-CM | POA: Diagnosis not present

## 2016-11-27 DIAGNOSIS — L89153 Pressure ulcer of sacral region, stage 3: Secondary | ICD-10-CM | POA: Diagnosis not present

## 2016-11-27 DIAGNOSIS — I10 Essential (primary) hypertension: Secondary | ICD-10-CM | POA: Diagnosis not present

## 2016-11-27 DIAGNOSIS — I69351 Hemiplegia and hemiparesis following cerebral infarction affecting right dominant side: Secondary | ICD-10-CM | POA: Diagnosis not present

## 2016-11-27 DIAGNOSIS — R1310 Dysphagia, unspecified: Secondary | ICD-10-CM | POA: Diagnosis not present

## 2016-11-30 DIAGNOSIS — L97512 Non-pressure chronic ulcer of other part of right foot with fat layer exposed: Secondary | ICD-10-CM | POA: Diagnosis not present

## 2016-11-30 DIAGNOSIS — L89153 Pressure ulcer of sacral region, stage 3: Secondary | ICD-10-CM | POA: Diagnosis not present

## 2016-11-30 DIAGNOSIS — I69391 Dysphagia following cerebral infarction: Secondary | ICD-10-CM | POA: Diagnosis not present

## 2016-11-30 DIAGNOSIS — I69351 Hemiplegia and hemiparesis following cerebral infarction affecting right dominant side: Secondary | ICD-10-CM | POA: Diagnosis not present

## 2016-11-30 DIAGNOSIS — R1310 Dysphagia, unspecified: Secondary | ICD-10-CM | POA: Diagnosis not present

## 2016-11-30 DIAGNOSIS — I10 Essential (primary) hypertension: Secondary | ICD-10-CM | POA: Diagnosis not present

## 2016-12-02 DIAGNOSIS — I69351 Hemiplegia and hemiparesis following cerebral infarction affecting right dominant side: Secondary | ICD-10-CM | POA: Diagnosis not present

## 2016-12-02 DIAGNOSIS — L97512 Non-pressure chronic ulcer of other part of right foot with fat layer exposed: Secondary | ICD-10-CM | POA: Diagnosis not present

## 2016-12-02 DIAGNOSIS — L89153 Pressure ulcer of sacral region, stage 3: Secondary | ICD-10-CM | POA: Diagnosis not present

## 2016-12-02 DIAGNOSIS — I10 Essential (primary) hypertension: Secondary | ICD-10-CM | POA: Diagnosis not present

## 2016-12-02 DIAGNOSIS — R1310 Dysphagia, unspecified: Secondary | ICD-10-CM | POA: Diagnosis not present

## 2016-12-02 DIAGNOSIS — I69391 Dysphagia following cerebral infarction: Secondary | ICD-10-CM | POA: Diagnosis not present

## 2016-12-04 DIAGNOSIS — L97512 Non-pressure chronic ulcer of other part of right foot with fat layer exposed: Secondary | ICD-10-CM | POA: Diagnosis not present

## 2016-12-04 DIAGNOSIS — I69391 Dysphagia following cerebral infarction: Secondary | ICD-10-CM | POA: Diagnosis not present

## 2016-12-04 DIAGNOSIS — L89153 Pressure ulcer of sacral region, stage 3: Secondary | ICD-10-CM | POA: Diagnosis not present

## 2016-12-04 DIAGNOSIS — R1310 Dysphagia, unspecified: Secondary | ICD-10-CM | POA: Diagnosis not present

## 2016-12-04 DIAGNOSIS — I10 Essential (primary) hypertension: Secondary | ICD-10-CM | POA: Diagnosis not present

## 2016-12-04 DIAGNOSIS — I69351 Hemiplegia and hemiparesis following cerebral infarction affecting right dominant side: Secondary | ICD-10-CM | POA: Diagnosis not present

## 2016-12-07 DIAGNOSIS — R1310 Dysphagia, unspecified: Secondary | ICD-10-CM | POA: Diagnosis not present

## 2016-12-07 DIAGNOSIS — I10 Essential (primary) hypertension: Secondary | ICD-10-CM | POA: Diagnosis not present

## 2016-12-07 DIAGNOSIS — I69391 Dysphagia following cerebral infarction: Secondary | ICD-10-CM | POA: Diagnosis not present

## 2016-12-07 DIAGNOSIS — I69351 Hemiplegia and hemiparesis following cerebral infarction affecting right dominant side: Secondary | ICD-10-CM | POA: Diagnosis not present

## 2016-12-07 DIAGNOSIS — L97512 Non-pressure chronic ulcer of other part of right foot with fat layer exposed: Secondary | ICD-10-CM | POA: Diagnosis not present

## 2016-12-07 DIAGNOSIS — L89153 Pressure ulcer of sacral region, stage 3: Secondary | ICD-10-CM | POA: Diagnosis not present

## 2016-12-09 DIAGNOSIS — L89153 Pressure ulcer of sacral region, stage 3: Secondary | ICD-10-CM | POA: Diagnosis not present

## 2016-12-09 DIAGNOSIS — R1310 Dysphagia, unspecified: Secondary | ICD-10-CM | POA: Diagnosis not present

## 2016-12-09 DIAGNOSIS — I10 Essential (primary) hypertension: Secondary | ICD-10-CM | POA: Diagnosis not present

## 2016-12-09 DIAGNOSIS — L97512 Non-pressure chronic ulcer of other part of right foot with fat layer exposed: Secondary | ICD-10-CM | POA: Diagnosis not present

## 2016-12-09 DIAGNOSIS — I69351 Hemiplegia and hemiparesis following cerebral infarction affecting right dominant side: Secondary | ICD-10-CM | POA: Diagnosis not present

## 2016-12-09 DIAGNOSIS — I69391 Dysphagia following cerebral infarction: Secondary | ICD-10-CM | POA: Diagnosis not present

## 2016-12-11 DIAGNOSIS — I69391 Dysphagia following cerebral infarction: Secondary | ICD-10-CM | POA: Diagnosis not present

## 2016-12-11 DIAGNOSIS — L89153 Pressure ulcer of sacral region, stage 3: Secondary | ICD-10-CM | POA: Diagnosis not present

## 2016-12-11 DIAGNOSIS — I69351 Hemiplegia and hemiparesis following cerebral infarction affecting right dominant side: Secondary | ICD-10-CM | POA: Diagnosis not present

## 2016-12-11 DIAGNOSIS — L97512 Non-pressure chronic ulcer of other part of right foot with fat layer exposed: Secondary | ICD-10-CM | POA: Diagnosis not present

## 2016-12-11 DIAGNOSIS — R1310 Dysphagia, unspecified: Secondary | ICD-10-CM | POA: Diagnosis not present

## 2016-12-11 DIAGNOSIS — I10 Essential (primary) hypertension: Secondary | ICD-10-CM | POA: Diagnosis not present

## 2016-12-14 ENCOUNTER — Encounter (HOSPITAL_BASED_OUTPATIENT_CLINIC_OR_DEPARTMENT_OTHER): Payer: Medicare Other | Attending: Internal Medicine

## 2016-12-14 DIAGNOSIS — L97515 Non-pressure chronic ulcer of other part of right foot with muscle involvement without evidence of necrosis: Secondary | ICD-10-CM | POA: Insufficient documentation

## 2016-12-14 DIAGNOSIS — I10 Essential (primary) hypertension: Secondary | ICD-10-CM | POA: Diagnosis not present

## 2016-12-14 DIAGNOSIS — L97512 Non-pressure chronic ulcer of other part of right foot with fat layer exposed: Secondary | ICD-10-CM | POA: Diagnosis not present

## 2016-12-14 DIAGNOSIS — I69391 Dysphagia following cerebral infarction: Secondary | ICD-10-CM | POA: Diagnosis not present

## 2016-12-14 DIAGNOSIS — I69351 Hemiplegia and hemiparesis following cerebral infarction affecting right dominant side: Secondary | ICD-10-CM | POA: Diagnosis not present

## 2016-12-14 DIAGNOSIS — L03032 Cellulitis of left toe: Secondary | ICD-10-CM | POA: Insufficient documentation

## 2016-12-14 DIAGNOSIS — L89154 Pressure ulcer of sacral region, stage 4: Secondary | ICD-10-CM | POA: Insufficient documentation

## 2016-12-14 DIAGNOSIS — R1310 Dysphagia, unspecified: Secondary | ICD-10-CM | POA: Diagnosis not present

## 2016-12-14 DIAGNOSIS — L89153 Pressure ulcer of sacral region, stage 3: Secondary | ICD-10-CM | POA: Diagnosis not present

## 2016-12-16 DIAGNOSIS — R1310 Dysphagia, unspecified: Secondary | ICD-10-CM | POA: Diagnosis not present

## 2016-12-16 DIAGNOSIS — L89153 Pressure ulcer of sacral region, stage 3: Secondary | ICD-10-CM | POA: Diagnosis not present

## 2016-12-16 DIAGNOSIS — I10 Essential (primary) hypertension: Secondary | ICD-10-CM | POA: Diagnosis not present

## 2016-12-16 DIAGNOSIS — L97512 Non-pressure chronic ulcer of other part of right foot with fat layer exposed: Secondary | ICD-10-CM | POA: Diagnosis not present

## 2016-12-16 DIAGNOSIS — I69351 Hemiplegia and hemiparesis following cerebral infarction affecting right dominant side: Secondary | ICD-10-CM | POA: Diagnosis not present

## 2016-12-16 DIAGNOSIS — I69391 Dysphagia following cerebral infarction: Secondary | ICD-10-CM | POA: Diagnosis not present

## 2016-12-18 DIAGNOSIS — L89154 Pressure ulcer of sacral region, stage 4: Secondary | ICD-10-CM | POA: Diagnosis not present

## 2016-12-18 DIAGNOSIS — L03032 Cellulitis of left toe: Secondary | ICD-10-CM | POA: Diagnosis not present

## 2016-12-18 DIAGNOSIS — L97515 Non-pressure chronic ulcer of other part of right foot with muscle involvement without evidence of necrosis: Secondary | ICD-10-CM | POA: Diagnosis not present

## 2016-12-18 DIAGNOSIS — S31000A Unspecified open wound of lower back and pelvis without penetration into retroperitoneum, initial encounter: Secondary | ICD-10-CM | POA: Diagnosis not present

## 2016-12-18 DIAGNOSIS — S91301A Unspecified open wound, right foot, initial encounter: Secondary | ICD-10-CM | POA: Diagnosis not present

## 2016-12-21 DIAGNOSIS — L97512 Non-pressure chronic ulcer of other part of right foot with fat layer exposed: Secondary | ICD-10-CM | POA: Diagnosis not present

## 2016-12-21 DIAGNOSIS — I69391 Dysphagia following cerebral infarction: Secondary | ICD-10-CM | POA: Diagnosis not present

## 2016-12-21 DIAGNOSIS — I10 Essential (primary) hypertension: Secondary | ICD-10-CM | POA: Diagnosis not present

## 2016-12-21 DIAGNOSIS — R1310 Dysphagia, unspecified: Secondary | ICD-10-CM | POA: Diagnosis not present

## 2016-12-21 DIAGNOSIS — L89153 Pressure ulcer of sacral region, stage 3: Secondary | ICD-10-CM | POA: Diagnosis not present

## 2016-12-21 DIAGNOSIS — I69351 Hemiplegia and hemiparesis following cerebral infarction affecting right dominant side: Secondary | ICD-10-CM | POA: Diagnosis not present

## 2016-12-23 DIAGNOSIS — I69351 Hemiplegia and hemiparesis following cerebral infarction affecting right dominant side: Secondary | ICD-10-CM | POA: Diagnosis not present

## 2016-12-23 DIAGNOSIS — I69391 Dysphagia following cerebral infarction: Secondary | ICD-10-CM | POA: Diagnosis not present

## 2016-12-23 DIAGNOSIS — R1310 Dysphagia, unspecified: Secondary | ICD-10-CM | POA: Diagnosis not present

## 2016-12-23 DIAGNOSIS — L89153 Pressure ulcer of sacral region, stage 3: Secondary | ICD-10-CM | POA: Diagnosis not present

## 2016-12-23 DIAGNOSIS — I10 Essential (primary) hypertension: Secondary | ICD-10-CM | POA: Diagnosis not present

## 2016-12-23 DIAGNOSIS — L97512 Non-pressure chronic ulcer of other part of right foot with fat layer exposed: Secondary | ICD-10-CM | POA: Diagnosis not present

## 2016-12-25 DIAGNOSIS — R1310 Dysphagia, unspecified: Secondary | ICD-10-CM | POA: Diagnosis not present

## 2016-12-25 DIAGNOSIS — I10 Essential (primary) hypertension: Secondary | ICD-10-CM | POA: Diagnosis not present

## 2016-12-25 DIAGNOSIS — L89153 Pressure ulcer of sacral region, stage 3: Secondary | ICD-10-CM | POA: Diagnosis not present

## 2016-12-25 DIAGNOSIS — L97512 Non-pressure chronic ulcer of other part of right foot with fat layer exposed: Secondary | ICD-10-CM | POA: Diagnosis not present

## 2016-12-25 DIAGNOSIS — I69391 Dysphagia following cerebral infarction: Secondary | ICD-10-CM | POA: Diagnosis not present

## 2016-12-25 DIAGNOSIS — I69351 Hemiplegia and hemiparesis following cerebral infarction affecting right dominant side: Secondary | ICD-10-CM | POA: Diagnosis not present

## 2016-12-28 DIAGNOSIS — I69391 Dysphagia following cerebral infarction: Secondary | ICD-10-CM | POA: Diagnosis not present

## 2016-12-28 DIAGNOSIS — R1310 Dysphagia, unspecified: Secondary | ICD-10-CM | POA: Diagnosis not present

## 2016-12-28 DIAGNOSIS — I69351 Hemiplegia and hemiparesis following cerebral infarction affecting right dominant side: Secondary | ICD-10-CM | POA: Diagnosis not present

## 2016-12-28 DIAGNOSIS — I10 Essential (primary) hypertension: Secondary | ICD-10-CM | POA: Diagnosis not present

## 2016-12-28 DIAGNOSIS — L97512 Non-pressure chronic ulcer of other part of right foot with fat layer exposed: Secondary | ICD-10-CM | POA: Diagnosis not present

## 2016-12-28 DIAGNOSIS — L89153 Pressure ulcer of sacral region, stage 3: Secondary | ICD-10-CM | POA: Diagnosis not present

## 2016-12-29 ENCOUNTER — Encounter (HOSPITAL_COMMUNITY): Payer: Self-pay

## 2016-12-29 ENCOUNTER — Emergency Department (HOSPITAL_COMMUNITY): Payer: Medicare Other

## 2016-12-29 ENCOUNTER — Inpatient Hospital Stay (HOSPITAL_COMMUNITY)
Admission: EM | Admit: 2016-12-29 | Discharge: 2017-01-07 | DRG: 296 | Disposition: A | Discharge status: Other Acute Inpatient Hospital | Payer: Medicare Other | Attending: Pulmonary Disease | Admitting: Pulmonary Disease

## 2016-12-29 ENCOUNTER — Inpatient Hospital Stay (HOSPITAL_COMMUNITY): Payer: Medicare Other

## 2016-12-29 DIAGNOSIS — J9600 Acute respiratory failure, unspecified whether with hypoxia or hypercapnia: Secondary | ICD-10-CM | POA: Diagnosis not present

## 2016-12-29 DIAGNOSIS — G931 Anoxic brain damage, not elsewhere classified: Secondary | ICD-10-CM | POA: Diagnosis present

## 2016-12-29 DIAGNOSIS — J690 Pneumonitis due to inhalation of food and vomit: Secondary | ICD-10-CM | POA: Diagnosis present

## 2016-12-29 DIAGNOSIS — J9601 Acute respiratory failure with hypoxia: Secondary | ICD-10-CM | POA: Diagnosis present

## 2016-12-29 DIAGNOSIS — R64 Cachexia: Secondary | ICD-10-CM | POA: Diagnosis present

## 2016-12-29 DIAGNOSIS — K9423 Gastrostomy malfunction: Secondary | ICD-10-CM | POA: Diagnosis present

## 2016-12-29 DIAGNOSIS — I517 Cardiomegaly: Secondary | ICD-10-CM | POA: Diagnosis not present

## 2016-12-29 DIAGNOSIS — E876 Hypokalemia: Secondary | ICD-10-CM | POA: Diagnosis present

## 2016-12-29 DIAGNOSIS — R570 Cardiogenic shock: Secondary | ICD-10-CM | POA: Diagnosis not present

## 2016-12-29 DIAGNOSIS — R6521 Severe sepsis with septic shock: Secondary | ICD-10-CM | POA: Diagnosis present

## 2016-12-29 DIAGNOSIS — M7989 Other specified soft tissue disorders: Secondary | ICD-10-CM | POA: Diagnosis not present

## 2016-12-29 DIAGNOSIS — Z7189 Other specified counseling: Secondary | ICD-10-CM | POA: Diagnosis not present

## 2016-12-29 DIAGNOSIS — J9690 Respiratory failure, unspecified, unspecified whether with hypoxia or hypercapnia: Secondary | ICD-10-CM | POA: Diagnosis not present

## 2016-12-29 DIAGNOSIS — I609 Nontraumatic subarachnoid hemorrhage, unspecified: Secondary | ICD-10-CM

## 2016-12-29 DIAGNOSIS — E46 Unspecified protein-calorie malnutrition: Secondary | ICD-10-CM | POA: Diagnosis present

## 2016-12-29 DIAGNOSIS — D638 Anemia in other chronic diseases classified elsewhere: Secondary | ICD-10-CM | POA: Diagnosis present

## 2016-12-29 DIAGNOSIS — Z4659 Encounter for fitting and adjustment of other gastrointestinal appliance and device: Secondary | ICD-10-CM

## 2016-12-29 DIAGNOSIS — I6340 Cerebral infarction due to embolism of unspecified cerebral artery: Secondary | ICD-10-CM | POA: Insufficient documentation

## 2016-12-29 DIAGNOSIS — R092 Respiratory arrest: Secondary | ICD-10-CM | POA: Diagnosis not present

## 2016-12-29 DIAGNOSIS — C189 Malignant neoplasm of colon, unspecified: Secondary | ICD-10-CM | POA: Diagnosis not present

## 2016-12-29 DIAGNOSIS — Z86718 Personal history of other venous thrombosis and embolism: Secondary | ICD-10-CM | POA: Diagnosis not present

## 2016-12-29 DIAGNOSIS — Z9289 Personal history of other medical treatment: Secondary | ICD-10-CM

## 2016-12-29 DIAGNOSIS — Z452 Encounter for adjustment and management of vascular access device: Secondary | ICD-10-CM

## 2016-12-29 DIAGNOSIS — R609 Edema, unspecified: Secondary | ICD-10-CM | POA: Diagnosis not present

## 2016-12-29 DIAGNOSIS — R4182 Altered mental status, unspecified: Secondary | ICD-10-CM | POA: Diagnosis not present

## 2016-12-29 DIAGNOSIS — L89153 Pressure ulcer of sacral region, stage 3: Secondary | ICD-10-CM | POA: Diagnosis present

## 2016-12-29 DIAGNOSIS — E871 Hypo-osmolality and hyponatremia: Secondary | ICD-10-CM | POA: Diagnosis present

## 2016-12-29 DIAGNOSIS — Z515 Encounter for palliative care: Secondary | ICD-10-CM | POA: Diagnosis present

## 2016-12-29 DIAGNOSIS — R0902 Hypoxemia: Secondary | ICD-10-CM | POA: Diagnosis not present

## 2016-12-29 DIAGNOSIS — Z85038 Personal history of other malignant neoplasm of large intestine: Secondary | ICD-10-CM

## 2016-12-29 DIAGNOSIS — Z9221 Personal history of antineoplastic chemotherapy: Secondary | ICD-10-CM

## 2016-12-29 DIAGNOSIS — J90 Pleural effusion, not elsewhere classified: Secondary | ICD-10-CM | POA: Diagnosis not present

## 2016-12-29 DIAGNOSIS — I469 Cardiac arrest, cause unspecified: Secondary | ICD-10-CM | POA: Diagnosis not present

## 2016-12-29 DIAGNOSIS — A419 Sepsis, unspecified organism: Secondary | ICD-10-CM | POA: Diagnosis present

## 2016-12-29 DIAGNOSIS — I639 Cerebral infarction, unspecified: Secondary | ICD-10-CM | POA: Diagnosis present

## 2016-12-29 DIAGNOSIS — G9340 Encephalopathy, unspecified: Secondary | ICD-10-CM | POA: Diagnosis present

## 2016-12-29 DIAGNOSIS — I10 Essential (primary) hypertension: Secondary | ICD-10-CM | POA: Diagnosis not present

## 2016-12-29 DIAGNOSIS — K226 Gastro-esophageal laceration-hemorrhage syndrome: Secondary | ICD-10-CM | POA: Diagnosis present

## 2016-12-29 DIAGNOSIS — Z4682 Encounter for fitting and adjustment of non-vascular catheter: Secondary | ICD-10-CM | POA: Diagnosis not present

## 2016-12-29 DIAGNOSIS — E8770 Fluid overload, unspecified: Secondary | ICD-10-CM | POA: Diagnosis present

## 2016-12-29 DIAGNOSIS — I6782 Cerebral ischemia: Secondary | ICD-10-CM | POA: Diagnosis not present

## 2016-12-29 DIAGNOSIS — Z681 Body mass index (BMI) 19 or less, adult: Secondary | ICD-10-CM

## 2016-12-29 DIAGNOSIS — I6330 Cerebral infarction due to thrombosis of unspecified cerebral artery: Secondary | ICD-10-CM | POA: Diagnosis not present

## 2016-12-29 DIAGNOSIS — Z08 Encounter for follow-up examination after completed treatment for malignant neoplasm: Secondary | ICD-10-CM | POA: Diagnosis not present

## 2016-12-29 DIAGNOSIS — E43 Unspecified severe protein-calorie malnutrition: Secondary | ICD-10-CM | POA: Diagnosis present

## 2016-12-29 DIAGNOSIS — L8990 Pressure ulcer of unspecified site, unspecified stage: Secondary | ICD-10-CM | POA: Insufficient documentation

## 2016-12-29 DIAGNOSIS — Z7901 Long term (current) use of anticoagulants: Secondary | ICD-10-CM | POA: Diagnosis not present

## 2016-12-29 DIAGNOSIS — R739 Hyperglycemia, unspecified: Secondary | ICD-10-CM | POA: Diagnosis present

## 2016-12-29 DIAGNOSIS — Z931 Gastrostomy status: Secondary | ICD-10-CM

## 2016-12-29 DIAGNOSIS — Z8673 Personal history of transient ischemic attack (TIA), and cerebral infarction without residual deficits: Secondary | ICD-10-CM | POA: Diagnosis not present

## 2016-12-29 DIAGNOSIS — Z978 Presence of other specified devices: Secondary | ICD-10-CM

## 2016-12-29 DIAGNOSIS — I619 Nontraumatic intracerebral hemorrhage, unspecified: Secondary | ICD-10-CM | POA: Insufficient documentation

## 2016-12-29 HISTORY — DX: Cerebral infarction, unspecified: I63.9

## 2016-12-29 HISTORY — DX: Malignant neoplasm of colon, unspecified: C18.9

## 2016-12-29 LAB — LACTIC ACID, PLASMA: LACTIC ACID, VENOUS: 5.6 mmol/L — AB (ref 0.5–1.9)

## 2016-12-29 LAB — I-STAT ARTERIAL BLOOD GAS, ED
Acid-Base Excess: 8 mmol/L — ABNORMAL HIGH (ref 0.0–2.0)
BICARBONATE: 29.4 mmol/L — AB (ref 20.0–28.0)
O2 SAT: 100 %
PCO2 ART: 27.6 mmHg — AB (ref 32.0–48.0)
PO2 ART: 183 mmHg — AB (ref 83.0–108.0)
Patient temperature: 98.6
TCO2: 30 mmol/L (ref 0–100)
pH, Arterial: 7.635 (ref 7.350–7.450)

## 2016-12-29 LAB — I-STAT CHEM 8, ED
BUN: 23 mg/dL — AB (ref 6–20)
CHLORIDE: 89 mmol/L — AB (ref 101–111)
Calcium, Ion: 0.99 mmol/L — ABNORMAL LOW (ref 1.15–1.40)
Creatinine, Ser: 0.5 mg/dL (ref 0.44–1.00)
Glucose, Bld: 194 mg/dL — ABNORMAL HIGH (ref 65–99)
HEMATOCRIT: 27 % — AB (ref 36.0–46.0)
Hemoglobin: 9.2 g/dL — ABNORMAL LOW (ref 12.0–15.0)
Potassium: 4.9 mmol/L (ref 3.5–5.1)
Sodium: 127 mmol/L — ABNORMAL LOW (ref 135–145)
TCO2: 29 mmol/L (ref 0–100)

## 2016-12-29 LAB — URINALYSIS, ROUTINE W REFLEX MICROSCOPIC
BILIRUBIN URINE: NEGATIVE
GLUCOSE, UA: 50 mg/dL — AB
Hgb urine dipstick: NEGATIVE
KETONES UR: NEGATIVE mg/dL
LEUKOCYTES UA: NEGATIVE
Nitrite: NEGATIVE
PH: 5 (ref 5.0–8.0)
Protein, ur: 100 mg/dL — AB
RBC / HPF: NONE SEEN RBC/hpf (ref 0–5)
Specific Gravity, Urine: 1.024 (ref 1.005–1.030)

## 2016-12-29 LAB — CBC WITH DIFFERENTIAL/PLATELET
BASOS ABS: 0 10*3/uL (ref 0.0–0.1)
Basophils Relative: 0 %
EOS ABS: 0 10*3/uL (ref 0.0–0.7)
Eosinophils Relative: 0 %
HEMATOCRIT: 26.3 % — AB (ref 36.0–46.0)
Hemoglobin: 8.2 g/dL — ABNORMAL LOW (ref 12.0–15.0)
LYMPHS ABS: 0.4 10*3/uL — AB (ref 0.7–4.0)
Lymphocytes Relative: 4 %
MCH: 23.8 pg — ABNORMAL LOW (ref 26.0–34.0)
MCHC: 31.2 g/dL (ref 30.0–36.0)
MCV: 76.2 fL — ABNORMAL LOW (ref 78.0–100.0)
MONOS PCT: 4 %
Monocytes Absolute: 0.4 10*3/uL (ref 0.1–1.0)
NEUTROS ABS: 10.2 10*3/uL — AB (ref 1.7–7.7)
Neutrophils Relative %: 92 %
Platelets: 420 10*3/uL — ABNORMAL HIGH (ref 150–400)
RBC: 3.45 MIL/uL — ABNORMAL LOW (ref 3.87–5.11)
RDW: 17 % — AB (ref 11.5–15.5)
WBC: 11 10*3/uL — ABNORMAL HIGH (ref 4.0–10.5)

## 2016-12-29 LAB — COMPREHENSIVE METABOLIC PANEL
ALBUMIN: 1.8 g/dL — AB (ref 3.5–5.0)
ALT: 29 U/L (ref 14–54)
AST: 47 U/L — AB (ref 15–41)
Alkaline Phosphatase: 75 U/L (ref 38–126)
Anion gap: 8 (ref 5–15)
BILIRUBIN TOTAL: 0.4 mg/dL (ref 0.3–1.2)
BUN: 19 mg/dL (ref 6–20)
CALCIUM: 7.4 mg/dL — AB (ref 8.9–10.3)
CO2: 27 mmol/L (ref 22–32)
CREATININE: 0.49 mg/dL (ref 0.44–1.00)
Chloride: 93 mmol/L — ABNORMAL LOW (ref 101–111)
GFR calc Af Amer: >60 mL/min (ref >60)
GFR calc non Af Amer: >60 mL/min (ref >60)
GLUCOSE: 195 mg/dL — AB (ref 65–99)
Potassium: 5.1 mmol/L (ref 3.5–5.1)
Sodium: 128 mmol/L — ABNORMAL LOW (ref 135–145)
TOTAL PROTEIN: 5.5 g/dL — AB (ref 6.5–8.1)

## 2016-12-29 LAB — AMMONIA: Ammonia: 9 umol/L (ref 9–35)

## 2016-12-29 LAB — I-STAT TROPONIN, ED: Troponin i, poc: 0.07 ng/mL (ref 0.00–0.08)

## 2016-12-29 LAB — PROCALCITONIN: Procalcitonin: 0.58 ng/mL

## 2016-12-29 LAB — MRSA PCR SCREENING: MRSA by PCR: NEGATIVE

## 2016-12-29 LAB — I-STAT CG4 LACTIC ACID, ED: LACTIC ACID, VENOUS: 4.2 mmol/L — AB (ref 0.5–1.9)

## 2016-12-29 LAB — TROPONIN I: TROPONIN I: 0.1 ng/mL — AB (ref <0.03)

## 2016-12-29 LAB — ETHANOL: Alcohol, Ethyl (B): <5 mg/dL (ref <5)

## 2016-12-29 MED ORDER — VANCOMYCIN HCL IN DEXTROSE 1-5 GM/200ML-% IV SOLN
1000.0000 mg | Freq: Once | INTRAVENOUS | Status: AC
  Administered 2016-12-29: 1000 mg via INTRAVENOUS
  Filled 2016-12-29: qty 200

## 2016-12-29 MED ORDER — HEPARIN (PORCINE) IN NACL 100-0.45 UNIT/ML-% IJ SOLN
950.0000 [IU]/h | INTRAMUSCULAR | Status: DC
  Administered 2016-12-29: 800 [IU]/h via INTRAVENOUS
  Filled 2016-12-29: qty 250

## 2016-12-29 MED ORDER — SODIUM CHLORIDE 0.9 % IV SOLN
INTRAVENOUS | Status: DC
  Administered 2016-12-30 – 2016-12-31 (×3): via INTRAVENOUS

## 2016-12-29 MED ORDER — NOREPINEPHRINE BITARTRATE 1 MG/ML IV SOLN
0.0000 ug/min | INTRAVENOUS | Status: DC
  Administered 2016-12-29: 2 ug/min via INTRAVENOUS
  Filled 2016-12-29: qty 4

## 2016-12-29 MED ORDER — VANCOMYCIN HCL 500 MG IV SOLR
500.0000 mg | INTRAVENOUS | Status: DC
  Administered 2016-12-30 – 2016-12-31 (×2): 500 mg via INTRAVENOUS
  Filled 2016-12-29 (×2): qty 500

## 2016-12-29 MED ORDER — DEXTROSE 5 % IV SOLN
1.0000 g | Freq: Once | INTRAVENOUS | Status: AC
  Administered 2016-12-29: 1 g via INTRAVENOUS
  Filled 2016-12-29: qty 1

## 2016-12-29 MED ORDER — ORAL CARE MOUTH RINSE
15.0000 mL | OROMUCOSAL | Status: DC
  Administered 2016-12-29 – 2017-01-07 (×84): 15 mL via OROMUCOSAL

## 2016-12-29 MED ORDER — VASOPRESSIN 20 UNIT/ML IV SOLN
0.0300 [IU]/min | INTRAVENOUS | Status: DC
  Administered 2016-12-30: 0.03 [IU]/min via INTRAVENOUS
  Filled 2016-12-29: qty 2

## 2016-12-29 MED ORDER — CEFEPIME HCL 1 G IJ SOLR: 1.0000 g | Freq: Two times a day (BID) | INTRAMUSCULAR | Status: DC

## 2016-12-29 MED ORDER — PANTOPRAZOLE SODIUM 40 MG IV SOLR
40.0000 mg | Freq: Every day | INTRAVENOUS | Status: DC
  Administered 2016-12-29 – 2017-01-01 (×4): 40 mg via INTRAVENOUS
  Filled 2016-12-29 (×4): qty 40

## 2016-12-29 MED ORDER — SODIUM CHLORIDE 0.9 % IV BOLUS (SEPSIS)
1000.0000 mL | Freq: Once | INTRAVENOUS | Status: AC
  Administered 2016-12-29: 1000 mL via INTRAVENOUS

## 2016-12-29 MED ORDER — NOREPINEPHRINE BITARTRATE 1 MG/ML IV SOLN
0.0000 ug/min | INTRAVENOUS | Status: DC
  Administered 2016-12-30: 25 ug/min via INTRAVENOUS
  Filled 2016-12-29: qty 16

## 2016-12-29 MED ORDER — DEXTROSE 5 % IV SOLN
1.0000 g | INTRAVENOUS | Status: DC
  Administered 2016-12-30 – 2017-01-01 (×3): 1 g via INTRAVENOUS
  Filled 2016-12-29 (×5): qty 1

## 2016-12-29 MED ORDER — SODIUM CHLORIDE 0.9 % IV SOLN: 250.0000 mL | INTRAVENOUS | Status: DC | PRN

## 2016-12-29 MED ORDER — CHLORHEXIDINE GLUCONATE 0.12% ORAL RINSE (MEDLINE KIT)
15.0000 mL | Freq: Two times a day (BID) | OROMUCOSAL | Status: DC
  Administered 2016-12-29 – 2017-01-07 (×18): 15 mL via OROMUCOSAL

## 2016-12-29 MED ORDER — FENTANYL CITRATE (PF) 100 MCG/2ML IJ SOLN
75.0000 ug | INTRAMUSCULAR | Status: DC | PRN
  Administered 2016-12-29 (×2): 75 ug via INTRAVENOUS
  Filled 2016-12-29 (×2): qty 2

## 2016-12-29 MED ORDER — ETOMIDATE 2 MG/ML IV SOLN
INTRAVENOUS | Status: AC | PRN
  Administered 2016-12-29: 20 mg via INTRAVENOUS

## 2016-12-29 MED ORDER — MIDAZOLAM HCL 2 MG/2ML IJ SOLN
2.0000 mg | INTRAMUSCULAR | Status: DC | PRN
  Administered 2016-12-29: 2 mg via INTRAVENOUS
  Filled 2016-12-29: qty 2

## 2016-12-29 MED ORDER — SUCCINYLCHOLINE CHLORIDE 20 MG/ML IJ SOLN
INTRAMUSCULAR | Status: AC | PRN
  Administered 2016-12-29: 90 mg via INTRAVENOUS

## 2016-12-29 NOTE — ED Notes (Signed)
1 set of blood cultures drawn by this RN. Unable to get second set. Antibiotics now started.

## 2016-12-29 NOTE — ED Triage Notes (Signed)
Witnessed respiratory arrest at home with family. Agonal on scene with EMS, CPR started and pt received 20 drips of epi through IO and ROSC achieved. VSS after. Pt still unresponsive but some shallow breaths.

## 2016-12-29 NOTE — Progress Notes (Addendum)
Pharmacy Antibiotic Note  Kristi Durham is a 83 y.o. female admitted on 12/29/2016 with pneumonia.  Pharmacy has been consulted for vancomycin dosing. No temp or WBC resulted yet, however, LA elevated at 4.2. SCr 0.5 for estimated CrCl ~ 25-30 mL/min. SCr likely underestimated as patient is non-ambulatory at home.   Plan: Vancomycin 1g IV x1, then 500 mg IV q24hr Cefepime 1g IV x1 per MD, then 1g IV q24hr per pharmacy  Monitor renal function, clinical picture, and culture data Vancomycin trough at SS and PRN (goal 15-20 mcg/mL) F/u length of therapy   Height: 5\' 2"  (157.5 cm) Weight: 130 lb (59 kg) IBW/kg (Calculated) : 50.1  No data recorded.   Recent Labs Lab 12/29/16 1557 12/29/16 1558  CREATININE 0.50  --   LATICACIDVEN  --  4.20*    Estimated Creatinine Clearance: 30.3 mL/min (by C-G formula based on SCr of 0.5 mg/dL).    No Known Allergies  Antimicrobials this admission: 6/26 Vanc >>  6/26 cefepime >>    Dose adjustments this admission: n/a  Microbiology results: pending   Argie Ramming, PharmD Pharmacy Resident  Pager [phone] 12/29/16 4:13 PM

## 2016-12-29 NOTE — Progress Notes (Signed)
Pt. Was transported to 2H15 without any complications.

## 2016-12-29 NOTE — Progress Notes (Signed)
PCCM INTERVAL NOTE  Spoke with patient's daughter, [name], at bedside at length concerning patients frail and critically ill condition with a guarded prognosis.  She expresses that her mothers wishes were to remain a FULL CODE at this time.  I reiterated that if her condition deteriorates that it is questionable if her mother would survive CPR given her age, frail state, and medical history.  Patient is responding to verbal and questionably following some commands at this time.Levophed is slowly being titrated down (at 4 mcg/min) peripherally.  She does not consent to a central line at this time and would like more time to think about that.  She will be leaving when her brother arrives but state they can make decisions as well.  We will continue current therapies for now.    Additional CCT: 25 mins  Kennieth Rad, AGACNP-BC [facility] Pgr: [phone] or if no answer [phone] 12/29/2016, 8:45 PM

## 2016-12-29 NOTE — Progress Notes (Signed)
ANTICOAGULATION CONSULT NOTE - Initial Consult  Pharmacy Consult for heparin Indication: chest pain/ACS  No Known Allergies  Patient Measurements: Height: 5\' 2"  (157.5 cm) Weight: 130 lb (59 kg) IBW/kg (Calculated) : 50.1  Vital Signs: BP: 155/103 (06/26 1900) Pulse Rate: 32 (06/26 1858)  Labs:  Recent Labs  12/29/16 1546 12/29/16 1557  HGB 8.2* 9.2*  HCT 26.3* 27.0*  PLT 420*  --   CREATININE 0.49 0.50   Assessment: CC/HPI: unresponsive > CPR > ROSC prior to ED arrival   Anticoag: none pta hep gtt for r/o acs  Neuro: head ct neg for bleed Renal: SCr 0.5  Heme/Onc: H&H 9.2/27, Plt 420  Goal of Therapy:  Heparin level 0.3-0.7 units/ml Monitor platelets by anticoagulation protocol: Yes   Plan:  Hep no bolus d/t advanced age Hep gtt at 800 units/hr Daily HL cbc (initial lvl with am labs) Monitor for s/sx of bleeding  Levester Fresh, PharmD, BCPS, BCCCP Clinical Pharmacist 12/29/2016 7:27 PM

## 2016-12-29 NOTE — Progress Notes (Signed)
PHARMACY NOTE:  ANTIMICROBIAL RENAL DOSAGE ADJUSTMENT  Current antimicrobial regimen includes a mismatch between antimicrobial dosage and estimated renal function.  As per policy approved by the Pharmacy & Therapeutics and Medical Executive Committees, the antimicrobial dosage will be adjusted accordingly.  Current antimicrobial dosage:  Cefepime 1g IV q12hr  Indication: HCAP  Renal Function:  Estimated Creatinine Clearance: 30.3 mL/min (by C-G formula based on SCr of 0.5 mg/dL). []      On intermittent HD, scheduled: []      On CRRT    Antimicrobial dosage has been changed to:  Cefepime 1g IV q24hr  Thank you for allowing pharmacy to be a part of this patient's care.  Argie Ramming, PharmD Pharmacy Resident  Pager [phone] 12/29/16 5:43 PM

## 2016-12-29 NOTE — Code Documentation (Signed)
This RN unable to gain more IV access. Dr Tyrone Nine and resident and room to try [place] IV. EJ access unsuccessful.

## 2016-12-29 NOTE — Code Documentation (Signed)
While in CT, pt's 22ga IV in left forearm accidentally removed. Approximately 156ml of the vancomycin had already went in. Vancomycin stopped.

## 2016-12-29 NOTE — H&P (Signed)
PULMONARY / CRITICAL CARE MEDICINE   Name: Kristi Durham MRN: [id number] DOB: 22-Oct-1914    ADMISSION DATE:  12/29/2016 CONSULTATION DATE:  12/29/2016  REFERRING MD:  ED  CHIEF COMPLAINT:  Cardiac arrest  HISTORY OF PRESENT ILLNESS:   History is obtained after speaking to her 2 sons and reviewing her medical record as well as the discharge summary from records from 90/7733. 83 year old presented from home after cardiac arrest. She was at her oncologist's office and was noted to be hypoxic to 90%, was brought back home and soon after sons had fed her via PEG, she developed agonal respirations and then stop breathing. EMS found her pulseless and required CPR with unknown downtime and epinephrine drip via IO before ROSC.  Past medical history of ischemic strokes and subarachnoid hemorrhage in 2015 was status post PEG due to dysphagia and malnutrition, partial complex seizures, colon cancer status post chemotherapy in remission for 8 years. Admission for/2018 [place] for hematemesis, found to have  Mallory-Weiss tear, found to have a sacral decubitus stage III  PAST MEDICAL HISTORY :  She  has a past medical history of Stroke [facility]).  PAST SURGICAL HISTORY: She  has no past surgical history on file.  No Known Allergies  No current facility-administered medications on file prior to encounter.    No current outpatient prescriptions on file prior to encounter.    FAMILY HISTORY:  Her has no family status information on file.    SOCIAL HISTORY: She  reports that she has never smoked. She has quit using smokeless tobacco. She reports that she does not drink alcohol.  REVIEW OF SYSTEMS:   Unable to obtain since unresponsive  SUBJECTIVE:    VITAL SIGNS: BP (!) 66/51   Pulse 73   Resp 16   Ht 5\' 2"  (1.575 m)   Wt 130 lb (59 kg)   SpO2 100%   BMI 23.78 kg/m   HEMODYNAMICS:    VENTILATOR SETTINGS: Vent Mode: PRVC FiO2 (%):  [50 %-100 %] 50 % Set Rate:  [16 bmp] 16  bmp Vt Set:  [350 mL-440 mL] 350 mL PEEP:  [5 cmH20] 5 cmH20 Plateau Pressure:  [24 cmH20] 24 cmH20  INTAKE / OUTPUT: No intake/output data recorded.  PHYSICAL EXAMINATION: General:  Thin, frail, elderly, orally intubated Neuro:  Unresponsive, pupils 2 mm and not reactive to light HEENT:  no pallor or icterus Cardiovascular:  S1 and S2 tachycardia, no murmurs Lungs:  Bilateral air entry, coarse breath sounds with scattered rhonchi, thick secretions milky white Abdomen:  Soft, nontender Musculoskeletal:  Mild contractures both upper extremities, IO left shin Skin:  No rash  LABS:  BMET  Recent Labs Lab 12/29/16 1546 12/29/16 1557  NA 128* 127*  K 5.1 4.9  CL 93* 89*  CO2 27  --   BUN 19 23*  CREATININE 0.49 0.50  GLUCOSE 195* 194*    Electrolytes  Recent Labs Lab 12/29/16 1546  CALCIUM 7.4*    CBC  Recent Labs Lab 12/29/16 1546 12/29/16 1557  WBC PENDING  --   HGB 8.2* 9.2*  HCT 26.3* 27.0*  PLT PENDING  --     Coag's No results for input(s): APTT, INR in the last 168 hours.  Sepsis Markers  Recent Labs Lab 12/29/16 1558  LATICACIDVEN 4.20*    ABG  Recent Labs Lab 12/29/16 1637  PHART 7.635*  PCO2ART 27.6*  PO2ART 183.0*    Liver Enzymes  Recent Labs Lab 12/29/16 1546  AST 47*  ALT 29  ALKPHOS 75  BILITOT 0.4  ALBUMIN 1.8*    Cardiac Enzymes No results for input(s): TROPONINI, PROBNP in the last 168 hours.  Glucose No results for input(s): GLUCAP in the last 168 hours.  Imaging Dg Chest Portable 1 View  Result Date: 12/29/2016 CLINICAL DATA:  Respiratory distress.  Post intubation. EXAM: PORTABLE CHEST 1 VIEW COMPARISON:  None. FINDINGS: An endotracheal tube is identified 1.5 cm above carina, directed toward the right mainstem bronchus. An NG tube is present entering the abdomen with tip off the field of view. Cardiomegaly is noted. Bilateral pleural effusions and bibasilar atelectasis/consolidation noted. There is no  evidence of pneumothorax. IMPRESSION: Bilateral pleural effusions and bibasilar atelectasis/ consolidation. Support apparatus as described. Cardiomegaly. Electronically Signed   By: Margarette Canada M.D.   On: 12/29/2016 15:39     STUDIES:  Head Ct 6/26 >>  CULTURES:   ANTIBIOTICS:   SIGNIFICANT EVENTS:   LINES/TUBES: ETT 6/26 >>  DISCUSSION:  Appears to be respiratory arrest followed by cardiac arrest- etiology is unclear, could be related to aspiration or seizure, have to rule out acute coronary syndrome chest x-ray shows bibasal consolidation/effusions.   ASSESSMENT / PLAN:  PULMONARY A: Respiratory arrest/acute respiratory failure Suspect aspiration pneumonia P:   Ventilator settings were reviewed, tidal volume will be lowered and respiratory rate set at 12 due to severe respiratory alkalosis on ABG  CARDIOVASCULAR A:  Shock, likely cardiogenic versus septic EKG shows LAHB & prolonged qT P:  Levophed drip , titrate to MAP 65 and above Cycle troponins   RENAL A:   Hyponatremia, chronic P:   Follow electrolytes   GASTROINTESTINAL A:   Severe protein calorie malnutrition, albumin 1.8 P:   Nothing by mouth for now   HEMATOLOGIC A:   Anemia of chronic disease P:  Transfuse for hemoglobin 7 or lower   INFECTIOUS A:   Aspiration pneumonia, recent hospitalization 10/2016 P:   Broad-spectrum vancomycin and Zosyn   ENDOCRINE A:   No issues   P:   CBGs while nothing by mouth   NEUROLOGIC A:   At 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 I gave them a guarded prognosis  -  Inter-disciplinary family meet or Palliative Care meeting due by:  day 7    My cc time x 63m  Kara Mead MD. [facility]. [facility] Pager [phone] If no response call [phone]     12/29/2016, 5:01 PM

## 2016-12-29 NOTE — ED Notes (Signed)
RN's made multiple attempts to Blood cultures and unsuccessful. Antibiotics started without blood cultures

## 2016-12-29 NOTE — ED Provider Notes (Signed)
[facility] DEPT Provider Note   CSN: [id number] Arrival date & time: 12/29/16  1512     History   Chief Complaint Chief Complaint  Patient presents with  . Respiratory Arrest    HPI Kristi Durham is a 83 y.o. female.  The history is provided by the EMS personnel, a relative and medical records.  Altered Mental Status   This is a new problem. The current episode started 1 to 2 hours ago. The problem has not changed since onset.Associated symptoms include unresponsiveness.    Past Medical History:  Diagnosis Date  . Colon cancer ([place])   . Stroke [facility])     Patient Active Problem List   Diagnosis Date Noted  . Encounter for central line placement   . Respiratory arrest ([place])   . Goals of care, counseling/discussion   . Palliative care by specialist   . Acute respiratory failure ([place])   . Cardiac arrest ([place]) 12/29/2016    History reviewed. No pertinent surgical history.  OB History    No data available       Home Medications    Prior to Admission medications   Not on File    Family History History reviewed. No pertinent family history.  Social History Social History  Substance Use Topics  . Smoking status: Never Smoker  . Smokeless tobacco: Former Systems developer  . Alcohol use No     Allergies   Patient has no known allergies.   Review of Systems Review of Systems  Unable to perform ROS: Patient unresponsive     Physical Exam Updated Vital Signs BP 124/78   Pulse 89   Temp 98.6 F (37 C)   Resp 17   Ht _0  (1.575 m)   Wt 59 kg (130 lb)   SpO2 100%   BMI 23.78 kg/m   Physical Exam  Constitutional: She has a sickly appearance. She appears ill.  HENT:  Head: Normocephalic and atraumatic.  Eyes: Pupils are equal, round, and reactive to light.  Neck: Neck supple.  Cardiovascular: Normal rate.   Pulmonary/Chest: Apnea noted. She has no wheezes. She has no rales.  Abdominal: Soft. There is no tenderness.  Musculoskeletal: She  exhibits no edema or deformity.  Neurological: She is unresponsive.  Skin: Skin is warm.  Nursing note and vitals reviewed.    ED Treatments / Results  Labs (all labs ordered are listed, but only abnormal results are displayed) Labs Reviewed  COMPREHENSIVE METABOLIC PANEL - Abnormal; Notable for the following:       Result Value   Sodium 128 (*)    Chloride 93 (*)    Glucose, Bld 195 (*)    Calcium 7.4 (*)    Total Protein 5.5 (*)    Albumin 1.8 (*)    AST 47 (*)    All other components within normal limits  CBC WITH DIFFERENTIAL/PLATELET - Abnormal; Notable for the following:    WBC 11.0 (*)    RBC 3.45 (*)    Hemoglobin 8.2 (*)    HCT 26.3 (*)    MCV 76.2 (*)    MCH 23.8 (*)    RDW 17.0 (*)    Platelets 420 (*)    Neutro Abs 10.2 (*)    Lymphs Abs 0.4 (*)    All other components within normal limits  URINALYSIS, ROUTINE W REFLEX MICROSCOPIC - Abnormal; Notable for the following:    Color, Urine AMBER (*)    APPearance CLOUDY (*)    Glucose, UA  50 (*)    Protein, ur 100 (*)    Bacteria, UA RARE (*)    Squamous Epithelial / LPF 0-5 (*)    Non Squamous Epithelial 0-5 (*)    All other components within normal limits  TROPONIN I - Abnormal; Notable for the following:    Troponin I 0.10 (*)    All other components within normal limits  TROPONIN I - Abnormal; Notable for the following:    Troponin I 0.05 (*)    All other components within normal limits  BASIC METABOLIC PANEL - Abnormal; Notable for the following:    Sodium 126 (*)    Chloride 92 (*)    Glucose, Bld 131 (*)    Creatinine, Ser 0.42 (*)    Calcium 7.7 (*)    All other components within normal limits  PHOSPHORUS - Abnormal; Notable for the following:    Phosphorus 2.2 (*)    All other components within normal limits  HEPARIN LEVEL (UNFRACTIONATED) - Abnormal; Notable for the following:    Heparin Unfractionated <0.10 (*)    All other components within normal limits  LACTIC ACID, PLASMA - Abnormal;  Notable for the following:    Lactic Acid, Venous 5.6 (*)    All other components within normal limits  BLOOD GAS, ARTERIAL - Abnormal; Notable for the following:    pO2, Arterial 114 (*)    Bicarbonate 30.3 (*)    Acid-Base Excess 6.0 (*)    All other components within normal limits  I-STAT CHEM 8, ED - Abnormal; Notable for the following:    Sodium 127 (*)    Chloride 89 (*)    BUN 23 (*)    Glucose, Bld 194 (*)    Calcium, Ion 0.99 (*)    Hemoglobin 9.2 (*)    HCT 27.0 (*)    All other components within normal limits  I-STAT CG4 LACTIC ACID, ED - Abnormal; Notable for the following:    Lactic Acid, Venous 4.20 (*)    All other components within normal limits  I-STAT ARTERIAL BLOOD GAS, ED - Abnormal; Notable for the following:    pH, Arterial 7.635 (*)    pCO2 arterial 27.6 (*)    pO2, Arterial 183.0 (*)    Bicarbonate 29.4 (*)    Acid-Base Excess 8.0 (*)    All other components within normal limits  CULTURE, BLOOD (ROUTINE X 2)  CULTURE, BLOOD (ROUTINE X 2)  MRSA PCR SCREENING  CULTURE, RESPIRATORY (NON-EXPECTORATED)  URINE CULTURE  AMMONIA  ETHANOL  PROCALCITONIN  MAGNESIUM  LACTIC ACID, PLASMA  GLUCOSE, CAPILLARY  GLUCOSE, CAPILLARY  MAGNESIUM  PHOSPHORUS  I-STAT TROPOININ, ED    EKG  EKG Interpretation  Date/Time:  Tuesday December 29 2016 15:15:04 EDT Ventricular Rate:  94 PR Interval:    QRS Duration: 92 QT Interval:  435 QTC Calculation: 544 R Axis:   -81 Text Interpretation:  Sinus rhythm LAD, consider left anterior fascicular block Low voltage, precordial leads Consider right ventricular hypertrophy Borderline T abnormalities, diffuse leads Prolonged QT interval No old tracing to compare Confirmed by Deno Etienne [phone]) on 12/29/2016 3:29:39 PM Also confirmed by Deno Etienne [phone]), editor Hattie Perch ([id number])  on 12/30/2016 7:00:45 AM       Radiology Ct Head Wo Contrast  Result Date: 12/29/2016 CLINICAL DATA:  Respiratory arrest.  History of  stroke. EXAM: CT HEAD WITHOUT CONTRAST TECHNIQUE: Contiguous axial images were obtained from the base of the skull through the vertex without intravenous contrast.  COMPARISON:  None. FINDINGS: Brain: There is no evidence for acute hemorrhage, hydrocephalus, mass lesion, or abnormal extra-axial fluid collection. No definite CT evidence for acute infarction. Diffuse loss of parenchymal volume is consistent with atrophy. Patchy low attenuation in the deep hemispheric and periventricular white matter is nonspecific, but likely reflects chronic microvascular ischemic demyelination. White matter hypoattenuation high left frontal lobe may be post ischemic but there appears to be some mass-effect and preservation of the overlying cortex. As such, vasogenic edema is a concern. Vascular: No hyperdense vessel or unexpected calcification. Skull: No evidence for fracture. No worrisome lytic or sclerotic lesion. Sinuses/Orbits: The visualized paranasal sinuses and mastoid air cells are clear. Visualized portions of the globes and intraorbital fat are unremarkable. Other: None. IMPRESSION: 1. Advanced atrophy and chronic small vessel white matter ischemic disease. White matter hypo attenuation in the high left frontal region is identified with apparent preservation of the overlying cortex, raising concern for vasogenic edema. As underlying mass lesion would be a consideration, MRI without and with contrast would be the study of choice to further evaluate. Electronically Signed   By: Misty Stanley M.D.   On: 12/29/2016 19:20   Dg Chest Port 1 View  Result Date: 12/30/2016 CLINICAL DATA:  Respiratory failure, aspiration, intubated patient. Status post cardiac arrest. EXAM: PORTABLE CHEST 1 VIEW COMPARISON:  Portable chest x-ray of December 30, 2016 FINDINGS: The patient remains rotated on today's study. The right lung is well-expanded. There is hazy increased density in the lower 1/2 of the right hemithorax which likely reflects  pleural fluid layering posteriorly. On the left there is a small pleural effusion laterally. The retrocardiac region remains dense. The heart is mildly enlarged. The pulmonary vascularity is not clearly engorged. The endotracheal tube tip lies 1.9 cm above the carina. The esophagogastric tube tip projects below the inferior margin of the image. The right internal jugular venous catheter tip projects over the midportion of the SVC. There is calcification in the wall of the aortic arch. IMPRESSION: Persistent bibasilar atelectasis or pneumonia with small bilateral pleural effusions. Cardiomegaly without significant pulmonary vascular congestion. The support tubes are in reasonable position. Electronically Signed   By: David  Martinique M.D.   On: 12/30/2016 07:57   Dg Chest Port 1 View  Result Date: 12/30/2016 CLINICAL DATA:  83 y/o  F; central line placement. EXAM: PORTABLE CHEST 1 VIEW COMPARISON:  12/29/2016 chest radiograph FINDINGS: Stable mildly enlarged cardiac silhouette. Aortic atherosclerosis with calcification. Stable endotracheal to 1.7 cm from the carina. Enteric tube tip below the field of view in the abdomen. Right central venous catheter tip projects over mid SVC. Bibasilar opacities are stable compatible with small effusions and associated atelectasis/consolidation. IMPRESSION: 1. Right central venous catheter tip projects over mid SVC. Other lines and tubes are stable. 2. Small bilateral effusions and bibasilar atelectasis/consolidations are stable. 3. Stable mild cardiomegaly. Electronically Signed   By: Kristine Garbe M.D.   On: 12/30/2016 01:27   Dg Chest Portable 1 View  Result Date: 12/29/2016 CLINICAL DATA:  Respiratory distress.  Post intubation. EXAM: PORTABLE CHEST 1 VIEW COMPARISON:  None. FINDINGS: An endotracheal tube is identified 1.5 cm above carina, directed toward the right mainstem bronchus. An NG tube is present entering the abdomen with tip off the field of view.  Cardiomegaly is noted. Bilateral pleural effusions and bibasilar atelectasis/consolidation noted. There is no evidence of pneumothorax. IMPRESSION: Bilateral pleural effusions and bibasilar atelectasis/ consolidation. Support apparatus as described. Cardiomegaly. Electronically Signed   By: Dellis Filbert  Hu M.D.   On: 12/29/2016 15:39    Procedures Procedures (including critical care time)  Medications Ordered in ED Medications  fentaNYL (SUBLIMAZE) injection 75 mcg (75 mcg Intravenous Given 12/29/16 1727)  midazolam (VERSED) injection 2 mg (2 mg Intravenous Given 12/29/16 1745)  vancomycin (VANCOCIN) 500 mg in sodium chloride 0.9 % 100 mL IVPB (not administered)  pantoprazole (PROTONIX) injection 40 mg (40 mg Intravenous Given 12/29/16 2248)  ceFEPIme (MAXIPIME) 1 g in dextrose 5 % 50 mL IVPB (not administered)  chlorhexidine gluconate (MEDLINE KIT) (PERIDEX) 0.12 % solution 15 mL (15 mLs Mouth Rinse Given 12/30/16 0800)  MEDLINE mouth rinse (15 mLs Mouth Rinse Given 12/30/16 1200)  0.9 %  sodium chloride infusion ( Intravenous New Bag/Given 12/30/16 0800)  vasopressin (PITRESSIN) 40 Units in sodium chloride 0.9 % 250 mL (0.16 Units/mL) infusion (0 Units/min Intravenous Stopped 12/30/16 0942)  norepinephrine (LEVOPHED) 16 mg in dextrose 5 % 250 mL (0.064 mg/mL) infusion (0 mcg/min Intravenous Stopped 12/30/16 0417)  insulin aspart (novoLOG) injection 0-9 Units (0 Units Subcutaneous Not Given 12/30/16 1200)  feeding supplement (VITAL HIGH PROTEIN) liquid 1,000 mL (not administered)  multivitamin liquid 15 mL (not administered)  etomidate (AMIDATE) injection (20 mg Intravenous Given 12/29/16 1520)  succinylcholine (ANECTINE) injection (90 mg Intravenous Given 12/29/16 1520)  ceFEPIme (MAXIPIME) 1 g in dextrose 5 % 50 mL IVPB (0 g Intravenous Stopped 12/29/16 1731)  vancomycin (VANCOCIN) IVPB 1000 mg/200 mL premix (0 mg Intravenous Stopped 12/29/16 1849)  sodium chloride 0.9 % bolus 1,000 mL (0 mLs Intravenous  Stopped 12/30/16 0040)  potassium chloride 10 mEq in 50 mL *CENTRAL LINE* IVPB (0 mEq Intravenous Stopped 12/30/16 5277)     Initial Impression / Assessment and Plan / ED Course  I have reviewed the triage vital signs and the nursing notes.  Pertinent labs & imaging results that were available during my care of the patient were reviewed by me and considered in my medical decision making (see chart for details).     83 year old female history of colon cancer, GI bleed, SAH who presents obtunded via EMS for acute altered mental status.  Patient was at home when she was became altered, which was witnessed by her sons. She received CPR by family before EMS arrival. Initially noted to be PEA. Pt received brief round of CPR before pulses palpated. She received minimal epi drip. Sons accompanied patient to ED and request full code and aggressive measures. Patient remains obtunded on exam. Etomidate/succinylcholine ordered. Intubated with 7-5 tube. Per record review, patient with recent ICU admission at [facility] in April 2018 for acute GI bleeding.  CT Head showing high L frontal white matter hypoattenuation. Pt admitted to ICU for further management and evaluation. Pt stable at time of transfer.  Pt care d/w Dr. Tyrone Nine  Final Clinical Impressions(s) / ED Diagnoses   Final diagnoses:  Encounter for central line placement  Altered mental status  Acute respiratory failure ([place])  PEG tube malfunction ([place])  Encounter for orogastric (OG) tube placement    New Prescriptions There are no discharge medications for this patient.    Payton Emerald, MD 12/30/16 [place], [place], DO 12/30/16 1732

## 2016-12-30 ENCOUNTER — Inpatient Hospital Stay (HOSPITAL_COMMUNITY): Payer: Medicare Other

## 2016-12-30 DIAGNOSIS — R092 Respiratory arrest: Secondary | ICD-10-CM

## 2016-12-30 DIAGNOSIS — J9600 Acute respiratory failure, unspecified whether with hypoxia or hypercapnia: Secondary | ICD-10-CM

## 2016-12-30 DIAGNOSIS — Z452 Encounter for adjustment and management of vascular access device: Secondary | ICD-10-CM

## 2016-12-30 DIAGNOSIS — Z7189 Other specified counseling: Secondary | ICD-10-CM

## 2016-12-30 DIAGNOSIS — Z515 Encounter for palliative care: Secondary | ICD-10-CM

## 2016-12-30 LAB — BLOOD GAS, ARTERIAL
ACID-BASE EXCESS: 6 mmol/L — AB (ref 0.0–2.0)
BICARBONATE: 30.3 mmol/L — AB (ref 20.0–28.0)
DRAWN BY: 418751
FIO2: 50
LHR: 16 {breaths}/min
O2 SAT: 98.6 %
PEEP/CPAP: 5 cmH2O
PH ART: 7.424 (ref 7.350–7.450)
Patient temperature: 98.6
VT: 350 mL
pCO2 arterial: 47.2 mmHg (ref 32.0–48.0)
pO2, Arterial: 114 mmHg — ABNORMAL HIGH (ref 83.0–108.0)

## 2016-12-30 LAB — MAGNESIUM: MAGNESIUM: 1.8 mg/dL (ref 1.7–2.4)

## 2016-12-30 LAB — BASIC METABOLIC PANEL
Anion gap: 6 (ref 5–15)
BUN: 16 mg/dL (ref 6–20)
CHLORIDE: 92 mmol/L — AB (ref 101–111)
CO2: 28 mmol/L (ref 22–32)
CREATININE: 0.42 mg/dL — AB (ref 0.44–1.00)
Calcium: 7.7 mg/dL — ABNORMAL LOW (ref 8.9–10.3)
GFR calc Af Amer: >60 mL/min (ref >60)
GFR calc non Af Amer: >60 mL/min (ref >60)
Glucose, Bld: 131 mg/dL — ABNORMAL HIGH (ref 65–99)
Potassium: 3.7 mmol/L (ref 3.5–5.1)
SODIUM: 126 mmol/L — AB (ref 135–145)

## 2016-12-30 LAB — GLUCOSE, CAPILLARY
GLUCOSE-CAPILLARY: 104 mg/dL — AB (ref 65–99)
Glucose-Capillary: 91 mg/dL (ref 65–99)
Glucose-Capillary: 86 mg/dL (ref 65–99)
Glucose-Capillary: 111 mg/dL — ABNORMAL HIGH (ref 65–99)

## 2016-12-30 LAB — TROPONIN I: Troponin I: 0.05 ng/mL (ref <0.03)

## 2016-12-30 LAB — LACTIC ACID, PLASMA: Lactic Acid, Venous: 1.2 mmol/L (ref 0.5–1.9)

## 2016-12-30 LAB — URINE CULTURE

## 2016-12-30 LAB — PHOSPHORUS: Phosphorus: 2.2 mg/dL — ABNORMAL LOW (ref 2.5–4.6)

## 2016-12-30 LAB — HEPARIN LEVEL (UNFRACTIONATED): Heparin Unfractionated: <0.1 IU/mL — ABNORMAL LOW (ref 0.30–0.70)

## 2016-12-30 MED ORDER — DEXTROSE 5 % IV SOLN
0.0000 ug/min | INTRAVENOUS | Status: DC
  Administered 2016-12-30: 25 ug/min via INTRAVENOUS

## 2016-12-30 MED ORDER — IOPAMIDOL (ISOVUE-300) INJECTION 61%
INTRAVENOUS | Status: AC
  Administered 2016-12-30: 50 mL via GASTROSTOMY
  Filled 2016-12-30: qty 50

## 2016-12-30 MED ORDER — VITAL HIGH PROTEIN PO LIQD: 1000.0000 mL | ORAL | Status: DC

## 2016-12-30 MED ORDER — VITAL HIGH PROTEIN PO LIQD
1000.0000 mL | ORAL | Status: DC
  Administered 2016-12-30 – 2017-01-07 (×7): 1000 mL

## 2016-12-30 MED ORDER — POTASSIUM CHLORIDE 10 MEQ/50ML IV SOLN
10.0000 meq | INTRAVENOUS | Status: AC
  Administered 2016-12-30 (×2): 10 meq via INTRAVENOUS
  Filled 2016-12-30 (×2): qty 50

## 2016-12-30 MED ORDER — CHLORHEXIDINE GLUCONATE CLOTH 2 % EX PADS
6.0000 | MEDICATED_PAD | Freq: Every day | CUTANEOUS | Status: DC
  Administered 2016-12-31: 6 via TOPICAL

## 2016-12-30 MED ORDER — INSULIN ASPART 100 UNIT/ML ~~LOC~~ SOLN
0.0000 [IU] | SUBCUTANEOUS | Status: DC
  Administered 2016-12-31: 1 [IU] via SUBCUTANEOUS
  Administered 2017-01-01: 2 [IU] via SUBCUTANEOUS
  Administered 2017-01-01 – 2017-01-07 (×15): 1 [IU] via SUBCUTANEOUS

## 2016-12-30 MED ORDER — NOREPINEPHRINE BITARTRATE 1 MG/ML IV SOLN
0.0000 ug/min | INTRAVENOUS | Status: DC
  Administered 2016-12-30: 40 ug/min via INTRAVENOUS
  Filled 2016-12-30: qty 4

## 2016-12-30 MED ORDER — ADULT MULTIVITAMIN LIQUID CH
15.0000 mL | Freq: Every day | ORAL | Status: DC
  Administered 2016-12-30 – 2017-01-07 (×9): 15 mL
  Filled 2016-12-30 (×9): qty 15

## 2016-12-30 NOTE — Care Management Note (Signed)
Case Management Note Kristi Gibbons RN, BSN Unit 2W-Case Manager-- [place] coverage [phone]  Patient Details  Name: Kristi Durham MRN: [id number] Date of Birth: 1914/10/11  Subjective/Objective:   Pt admitted s/p cardiac arrest- currently on vent.               Action/Plan: PTA pt lived at home with family (sister is main caregiver)-  Hx of Peg tube with home TF- CM will follow for d/c needs   Expected Discharge Date:                  Expected Discharge Plan:     In-House Referral:     Discharge planning Services  CM Consult  Post Acute Care Choice:    Choice offered to:     DME Arranged:    DME Agency:     HH Arranged:    HH Agency:     Status of Service:  In process, will continue to follow  If discussed at Long Length of Stay Meetings, dates discussed:    Discharge Disposition:   Additional Comments:  Dawayne Patricia, RN 12/30/2016, 10:30 AM

## 2016-12-30 NOTE — Consult Note (Signed)
[place] Nurse wound consult note Patient from home, old CVA, contracted, bedbound. On the vent, however patient' family at bedside to discuss current wound status Reason for Consult: sacrum Wound type: Stage 3 Pressure Injury Pressure Injury POA: Yes Measurement: 3cm x 3cm x 0.5cm  Wound bed: 100% clean, pink, moist, pale and non granular Drainage (amount, consistency, odor) minimal, no odor Periwound: intact  Dressing procedure/placement/frequency: Silver hydrofiber for bioburdan management, cover with foam. Change 3x wk.  Maximize nutrition for wound healing SPORT mattress in place for pressure redistribution while in ICU, orders entered for LALM upon transfer to floor.  Discussed POC with patient and bedside nurse.  Re consult if needed, will not follow at this time. Thanks  Quanita Barona [employer], RN,CWOCN, CNS, [place] [phone])

## 2016-12-30 NOTE — Progress Notes (Signed)
[facility] for heparin Indication: chest pain/ACS  No Known Allergies  Patient Measurements: Height: 5\' 2"  (157.5 cm) Weight: 130 lb (59 kg) IBW/kg (Calculated) : 50.1  Vital Signs: Temp: 98.4 F (36.9 C) (06/27 0200) Temp Source: Core (Comment) (06/27 0000) BP: 147/93 (06/27 0200) Pulse Rate: 86 (06/27 0200)  Labs:  Recent Labs  12/29/16 1546 12/29/16 1557 12/29/16 2015 12/30/16 0130  HGB 8.2* 9.2*  --   --   HCT 26.3* 27.0*  --   --   PLT 420*  --   --   --   HEPARINUNFRC  --   --   --  <0.10*  CREATININE 0.49 0.50  --  0.42*  TROPONINI  --   --  0.10* 0.05*   Assessment: 83 y.o. female s/p cardiac arrest, possible ACS, for heparin  Goal of Therapy:  Heparin level 0.3-0.7 units/ml Monitor platelets by anticoagulation protocol: Yes   Plan:  Increase Heparin 950 units/hr Check heparin level in 8 hours.  Phillis Knack, PharmD, BCPS  12/30/2016 3:28 AM

## 2016-12-30 NOTE — Procedures (Signed)
Central Venous Catheter Insertion Procedure Note Robynne Roat [id number] [birth year]  Procedure: Insertion of Central Venous Catheter Indications: Assessment of intravascular volume, Drug and/or fluid administration and Frequent blood sampling  Procedure Details Consent: Risks of procedure as well as the alternatives and risks of each were explained to the (patient/caregiver).  Consent for procedure obtained. Time Out: Verified patient identification, verified procedure, site/side was marked, verified correct patient position, special equipment/implants available, medications/allergies/relevent history reviewed, required imaging and test results available.  Performed  Maximum sterile technique was used including antiseptics, cap, gloves, gown, hand hygiene, mask and sheet. Skin prep: Chlorhexidine; local anesthetic administered A antimicrobial bonded/coated triple lumen catheter was placed in the right internal jugular vein using the Seldinger technique at 16 cm.  Line sutured.  Biopatch applied.    Evaluation Blood flow good Complications: No apparent complications Patient did tolerate procedure well. Chest X-ray ordered to verify placement.  CXR: pending.  Procedure performed with ultrasound guidance for real time vessel cannulation.     Kennieth Rad, AGACNP-BC [facility] Pgr: [phone] or if no answer [phone] 12/30/2016, 12:44 AM

## 2016-12-30 NOTE — Progress Notes (Signed)
Patient was seen by NP earlier on 38mcg levophed and patient's blood pressure was stable... However, patient's blood pressure began to decompensate on 2 mcg of levophed around 11pm.  RN increased levophed and requested NS bolus for BP support from Gilmore MD.  Pt BP still low with 40 mcg levophed and bolus infusing.  RN notified Elink MD.  Vasopressin ordered, and NP notified to place central line for better blood pressure management with pressors.   RN will continue to monitor patient closely.

## 2016-12-30 NOTE — Progress Notes (Signed)
   12/30/16 1250  Clinical Encounter Type  Visited With Patient  Visit Type Other (Comment) ([place] consult)  Spiritual Encounters  Spiritual Needs Prayer  Stress Factors  Patient Stress Factors None identified  Pt intubated and resting. Offered prayer for health.

## 2016-12-30 NOTE — Procedures (Signed)
ELECTROENCEPHALOGRAM REPORT  Date of Study: 12/30/2016  Patient's Name: Kristi Durham MRN: [id number] Date of Birth: 1915-06-07  Referring Provider: Dr. Marshell Garfinkel  Clinical History: This is a 83 year old woman s/p cardiac arrest  Medications: ceFEPIme (MAXIPIME) 1 g in dextrose 5 % 50 mL IVPB  chlorhexidine gluconate (MEDLINE KIT) (PERIDEX) 0.12 % solution 15 mL  feeding supplement (VITAL HIGH PROTEIN) liquid 1,000 mL  insulin aspart (novoLOG) injection 0-9 Units  multivitamin liquid 15 mL  norepinephrine (LEVOPHED) 16 mg in dextrose 5 % 250 mL (0.064 mg/mL) infusion  pantoprazole (PROTONIX) injection 40 mg  vancomycin (VANCOCIN) 500 mg in sodium chloride 0.9 % 100 mL IVPB  vasopressin (PITRESSIN) 40 Units in sodium chloride 0.9 % 250 mL (0.16 Units/mL) infusion   Technical Summary: A multichannel digital EEG recording measured by the international 10-20 system with electrodes applied with paste and impedances below 5000 ohms performed as portable with EKG monitoring in an intubated patient.  Hyperventilation and photic stimulation were not performed.  The digital EEG was referentially recorded, reformatted, and digitally filtered in a variety of bipolar and referential montages for optimal display.   Description: The patient is intubated and unresponsive during the recording. No sedating medications listed. There is no clear posterior dominant rhythm. The background consists of a large amount of diffuse 4-5 Hz theta and 2-3 Hz delta slowing. There is a brief run of vertex waves seen once in the study.  Hyperventilation and photic stimulation were not performed.  There were periods of artifact over the right temporo-occipital leads. There were no epileptiform discharges or electrographic seizures seen.    EKG lead was unremarkable.  Impression: This EEG is abnormal due to moderate diffuse slowing of the background.  Clinical Correlation of the above findings indicates diffuse  cerebral dysfunction that is non-specific in etiology and can be seen with hypoxic/ischemic injury, toxic/metabolic encephalopathies, neurodegenerative disorders, or medication effect.  The absence of epileptiform discharges does not rule out a clinical diagnosis of epilepsy.  Clinical correlation is advised.   Ellouise Newer, M.D.

## 2016-12-30 NOTE — Progress Notes (Signed)
EEG Completed; Results Pending  

## 2016-12-30 NOTE — Progress Notes (Signed)
PULMONARY / CRITICAL CARE MEDICINE   Name: Kristi Durham MRN: [id number] DOB: Sep 22, 1914    ADMISSION DATE:  12/29/2016 CONSULTATION DATE:  12/30/2016  REFERRING MD:  ED  CHIEF COMPLAINT:  Cardiac arrest  HISTORY OF PRESENT ILLNESS:   History is obtained after speaking to her 2 sons and reviewing her medical record as well as the discharge summary from records from 08/5469. 83 year old presented from home after cardiac arrest. She was at her oncologist's office and was noted to be hypoxic to 90%, was brought back home and soon after sons had fed her via PEG, she developed agonal respirations and then stop breathing. EMS found her pulseless and required CPR with unknown downtime and epinephrine drip via IO before ROSC.  Past medical history of ischemic strokes and subarachnoid hemorrhage in 2015 was status post PEG due to dysphagia and malnutrition, partial complex seizures, colon cancer status post chemotherapy in remission for 8 years. Admission for/2018 [place] for hematemesis, found to have  Mallory-Weiss tear, found to have a sacral decubitus stage III  PAST MEDICAL HISTORY :  She  has a past medical history of Colon cancer ([place]) and Stroke ([place]).  PAST SURGICAL HISTORY: She  has no past surgical history on file.  No Known Allergies  No current facility-administered medications on file prior to encounter.    No current outpatient prescriptions on file prior to encounter.    FAMILY HISTORY:  Her has no family status information on file.    SOCIAL HISTORY: She  reports that she has never smoked. She has quit using smokeless tobacco. She reports that she does not drink alcohol.  REVIEW OF SYSTEMS:   Unable to obtain since unresponsive  SUBJECTIVE:  Seen by palliative care. Sons want everything done. Off levophed. Still on vasopressin.  VITAL SIGNS: BP 117/78   Pulse 81   Temp 99 F (37.2 C)   Resp (!) 23   Ht 5\' 2"  (1.575 m)   Wt 130 lb (59 kg)   SpO2 96%    BMI 23.78 kg/m   HEMODYNAMICS: CVP:  [2 mmHg-12 mmHg] 7 mmHg  VENTILATOR SETTINGS: Vent Mode: PRVC FiO2 (%):  [40 %-100 %] 40 % Set Rate:  [16 bmp] 16 bmp Vt Set:  [350 mL-440 mL] 350 mL PEEP:  [5 cmH20] 5 cmH20 Plateau Pressure:  [24 cmH20] 24 cmH20  INTAKE / OUTPUT: I/O last 3 completed shifts: In: 2578.3 [I.V.:2528.3; IV Piggyback:50] Out: 330 [Urine:330]  PHYSICAL EXAMINATION: Gen:      No acute distress, Frail, elderly HEENT:  EOMI, sclera anicteric, ETT in place Neck:     No masses; no thyromegaly Lungs:    Clear to auscultation bilaterally; normal respiratory effort CV:         Regular rate and rhythm; no murmurs Abd:      + bowel sounds; soft, non-tender; no palpable masses, no distension Ext:    UE contractures, Lt arm edema. Adequate peripheral perfusion Skin:      Sacral decb, Neuro: Sedated, unresponsive  LABS:  BMET  Recent Labs Lab 12/29/16 1546 12/29/16 1557 12/30/16 0130  NA 128* 127* 126*  K 5.1 4.9 3.7  CL 93* 89* 92*  CO2 27  --  28  BUN 19 23* 16  CREATININE 0.49 0.50 0.42*  GLUCOSE 195* 194* 131*    Electrolytes  Recent Labs Lab 12/29/16 1546 12/30/16 0130  CALCIUM 7.4* 7.7*  MG  --  1.8  PHOS  --  2.2*  CBC  Recent Labs Lab 12/29/16 1546 12/29/16 1557  WBC 11.0*  --   HGB 8.2* 9.2*  HCT 26.3* 27.0*  PLT 420*  --     Coag's No results for input(s): APTT, INR in the last 168 hours.  Sepsis Markers  Recent Labs Lab 12/29/16 1558 12/29/16 2013 12/29/16 2015 12/30/16 0130  LATICACIDVEN 4.20* 5.6*  --  1.2  PROCALCITON  --   --  0.58  --     ABG  Recent Labs Lab 12/29/16 1637 12/30/16 0115  PHART 7.635* 7.424  PCO2ART 27.6* 47.2  PO2ART 183.0* 114*    Liver Enzymes  Recent Labs Lab 12/29/16 1546  AST 47*  ALT 29  ALKPHOS 75  BILITOT 0.4  ALBUMIN 1.8*    Cardiac Enzymes  Recent Labs Lab 12/29/16 2015 12/30/16 0130  TROPONINI 0.10* 0.05*    Glucose No results for input(s): GLUCAP  in the last 168 hours.  Imaging  STUDIES:  Head CT 6/26 >> Advanced age-related atrophy, white matter attenuation in left frontal region. Chest x-ray 6/26>> ET tube in place, bilateral basal opacities concerning for pneumonia. I have reviewed all images personally.  CULTURES: Bcx 6/26 > GPC, GNR, GPR Sp CX 6/26 >  ANTIBIOTICS: Vanco 6/26 >> Cefepime 6/26 >>  SIGNIFICANT EVENTS:  LINES/TUBES: ETT 6/26 >> Lt IJ 6/27 >> I/O 6/26 >>  DISCUSSION: 83 Y/O with respiratory arrest followed by cardiac arrest- etiology is unclear, could be related to aspiration or seizure, have to rule out acute coronary syndrome chest x-ray shows bibasal consolidation/effusions.   ASSESSMENT / PLAN:  PULMONARY A: Respiratory arrest/acute respiratory failure Aspiration pneumonia P:   ABG and CXR reviewed Continue full vent support  CARDIOVASCULAR A:  Shock, likely cardiogenic versus septic EKG shows LAHB & prolonged qT Elevated troponin > trending down P:  Off levo. Stop vasopressin Stop heparin as suspicion for ACS is low Check echo  RENAL A:   Hyponatremia, chronic P:   Follow lytes  GASTROINTESTINAL A:   Severe protein calorie malnutrition, albumin 1.8 P:   Start feeds via PEG  HEMATOLOGIC A:   Anemia of chronic disease P:  Follow CBC, transfuse for Hb < 7.  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 Will get MRI and EEG Will call neuro if the above workup is abnormal  FAMILY  - Updates: 2 sons updated at bedside. They confirm full code.  - Inter-disciplinary family meet or Palliative Care meeting due by:  day 7  The patient is critically ill with multiple organ system failure and requires high complexity decision making for assessment and support,  frequent evaluation and titration of therapies, advanced monitoring, review of radiographic studies and interpretation of complex data.   Critical Care Time devoted to patient care services, exclusive of separately billable procedures, described in this note is 35 minutes.   Marshell Garfinkel MD  Pulmonary and Critical Care Pager [phone] If no answer or after 3pm call: [phone] 12/30/2016, 10:07 AM

## 2016-12-30 NOTE — Consult Note (Signed)
Consultation Note Date: 12/30/2016   Patient Name: Kristi Durham  DOB: 1914-11-20  MRN: [id number]  Age / Sex: 83 y.o., female  PCP: No primary care provider on file. Referring Physician: Rigoberto Noel, MD  Reason for Consultation: Establishing goals of care and Psychosocial/spiritual support  HPI/Patient Profile: 83 y.o. female  with past medical history of colon cancer (in remission), ischemic strokes and subarachnoid hemorrhage, s/p PEG d/t dysphagia, partial complex seizures, sacral decubitous stage III, and Mallory-Weiss tear. She presented to the ED from home after respiratory arrest followed by cardiac arrest. EMS on arrival found her pulseless and she required CPR and epinephrine before ROSC. She was admitted on 12/29/2016. Etiology remains unclear at this point, though aspiration is in the differential. Palliative consulted to assist in goals of care; she has two sons and one daughter.   Clinical Assessment and Goals of Care: Sanita is unable to meaningfully participate in a goals of care conversation as she is predominantly unresponsive on a vent. I was able to sit down with her daughter and two sons at her bedside.   We talked through a broad life review for Kristi Durham, then focused in on her health leading up to this admission. 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 In terms of health, she had been very healthy up until the stroke and bleed. She did rehab post stroke and was able to regain significant functional status and was able to be mostly independent at home (she lives with her daughter). More recently, the sacral decubitus did cause an acute decline with weakness, less ambulatory, and decreased intake with weight-loss. That ulcer is healing, however, and they had increased her tube  feeds with resultant weight gain. Just prior to admission they were discussing re-starting home physical therapy to aid in regaining her strength. She was mentally sharp and highly motivated to stay moving and healthy.  Presently, her family understands that the etiology of her respiratory and cardiac failure is unclear, however aspiration is in the differential. They are hopeful for more clarity in the cause. In terms of goals, they are focused on her improvement and eventual recovery. She has expressed to her family her desire for all life prolonging measures, to include CPR, defibrillation, intubation, artificial nutrition and hydration, and long term life-support if needed. Her daughter explains that they are solely focused on "Living," which they take to mean life-prolongation. [name] and her family believe strongly that she will improve, and while they accept it may be a prolonged process, they envision a future with her at home and engaging in a similar way to prior to admission.   As I explored their goals I gently touched on the possibility that she may not improve back to her prior functional and cognitive level. I specifically queried the importance of quality of life in these scenarios: if she is not be able to wean from the vent or she may not return to her mental state prior to admission. [name] acknowledges the possibility that she may not improve, but was not in a place to engage on this further. She reiterated that her mother's spirit is strong and she expects improvement.   Primary Decision Maker NEXT OF KIN    SUMMARY OF RECOMMENDATIONS    Full code, full scope treatment  Palliative will continue to follow and support  family as this plays out  Code Status/Advance Care Planning:  Full code  Additional Recommendations (Limitations, Scope, Preferences):  Full Scope Treatment  Psycho-social/Spiritual:   Desire for further Chaplaincy support:yes  Additional  Recommendations: TBD  Prognosis:   Unable to determine  Discharge Planning: To Be Determined      Primary Diagnoses: Present on Admission: . Cardiac arrest ([place])   I have reviewed the medical record, interviewed the patient and family, and examined the patient. The following aspects are pertinent.  Past Medical History:  Diagnosis Date  . Colon cancer ([place])   . Stroke [facility])    Social History   Social History  . Marital status: Divorced    Spouse name: N/A  . Number of children: N/A  . Years of education: N/A   Social History Main Topics  . Smoking status: Never Smoker  . Smokeless tobacco: Former Systems developer  . Alcohol use No  . Drug use: Unknown  . Sexual activity: Not Asked   Other Topics Concern  . None   Social History Narrative  . None   History reviewed. No pertinent family history. Scheduled Meds: . chlorhexidine gluconate (MEDLINE KIT)  15 mL Mouth Rinse BID  . mouth rinse  15 mL Mouth Rinse 10 times per day  . pantoprazole (PROTONIX) IV  40 mg Intravenous QHS   Continuous Infusions: . sodium chloride 75 mL/hr at 12/29/16 2141  . ceFEPime (MAXIPIME) IV    . heparin 950 Units/hr (12/30/16 0332)  . norepinephrine (LEVOPHED) Adult infusion Stopped (12/30/16 0417)  . vancomycin    . vasopressin (PITRESSIN) infusion - *FOR SHOCK* 0.03 Units/min (12/30/16 0015)   PRN Meds:.fentaNYL (SUBLIMAZE) injection, midazolam No Known Allergies   Review of Systems  Unable to perform ROS  Physical Exam  Constitutional: She appears well-developed and well-nourished.  HENT:  Intubated on vent  Neck:  Central line in place, dressing CDI  Cardiovascular: Normal rate and regular rhythm.   Pulmonary/Chest:  On vent. Scattered rhonchi  Abdominal: Soft. Bowel sounds are normal.  PEG in place, site benign  Musculoskeletal:  Left hand contracted, +1 edema LUE, trace edema in LLE. IO access remains in place  Neurological:  Unresponsive  Skin: Skin is warm and dry.  There is pallor.    Vital Signs: BP 127/81   Pulse 81   Temp 99 F (37.2 C)   Resp (!) 21   Ht '5\' 2"'$  (1.575 m)   Wt 59 kg (130 lb)   SpO2 99%   BMI 23.78 kg/m  Pain Assessment: CPOT   Pain Score: 0-No pain   SpO2: SpO2: 99 % O2 Device:SpO2: 99 % O2 Flow Rate: .O2 Flow Rate (L/min): 15 L/min  IO: Intake/output summary:  Intake/Output Summary (Last 24 hours) at 12/30/16 0805 Last data filed at 12/30/16 0700  Gross per 24 hour  Intake          2578.27 ml  Output              330 ml  Net          2248.27 ml    LBM:   Baseline Weight: Weight: 59 kg (130 lb) Most recent weight: Weight: 59 kg (130 lb)     Palliative Assessment/Data: PPS 10%   Time Total: 70 minutes Greater than 50%  of this time was spent counseling and coordinating care related to the above assessment and plan.  Signed by: Charlynn Court, NP Palliative Medicine Team Pager # [phone] (M-F 7a-5p) Team Phone #  [phone] (Nights/Weekends)

## 2016-12-30 NOTE — Progress Notes (Signed)
Initial Nutrition Assessment  INTERVENTION:   Vital High Protein @ 45 ml/hr  MVI daily  Provides: 1080 kcal, 94 grams protein, and 902 ml free water.    NUTRITION DIAGNOSIS:   Inadequate oral intake related to inability to eat as evidenced by NPO status.  GOAL:   Patient will meet greater than or equal to 90% of their needs  MONITOR:   TF tolerance, Skin  REASON FOR ASSESSMENT:   Consult Enteral/tube feeding initiation and management  ASSESSMENT:   Pt with PMH of colon cancer, stroke, PEG for dysphagia, and stage III ulcer who was admitted from home with after cardiac arrest.   No family present. Per RN family gives pt TF but also blenderized foods through PEG. Pt is a full code.   Patient is currently intubated on ventilator support MV: 5.9 L/min Temp (24hrs), Avg:97.3 F (36.3 C), Min:95.2 F (35.1 C), Max:99.1 F (37.3 C)  Labs reviewed: Na 126 (L), PO4 2.2 (L) Pt with severe fat and muscle depletion but per RN pt is immobile PTA.   Diet Order:  Diet NPO time specified  Skin:   (stage III sacrum)  Last BM:  6/26  Height:   Ht Readings from Last 1 Encounters:  12/29/16 5\' 2"  (1.575 m)    Weight:   Wt Readings from Last 1 Encounters:  12/29/16 130 lb (59 kg)    Ideal Body Weight:  50 kg  BMI:  Body mass index is 23.78 kg/m.  Estimated Nutritional Needs:   Kcal:  1100  Protein:  89-110 grams  Fluid:  > 1.5 L/day  EDUCATION NEEDS:   No education needs identified at this time  [place], [place], [place] Pager [phone] After Hours Pager

## 2016-12-31 ENCOUNTER — Inpatient Hospital Stay (HOSPITAL_COMMUNITY): Payer: Medicare Other

## 2016-12-31 DIAGNOSIS — R609 Edema, unspecified: Secondary | ICD-10-CM

## 2016-12-31 LAB — BASIC METABOLIC PANEL
Anion gap: 7 (ref 5–15)
BUN: 12 mg/dL (ref 6–20)
CALCIUM: 7.8 mg/dL — AB (ref 8.9–10.3)
CO2: 27 mmol/L (ref 22–32)
CREATININE: 0.35 mg/dL — AB (ref 0.44–1.00)
Chloride: 99 mmol/L — ABNORMAL LOW (ref 101–111)
GFR calc non Af Amer: >60 mL/min (ref >60)
Glucose, Bld: 122 mg/dL — ABNORMAL HIGH (ref 65–99)
Potassium: 3.6 mmol/L (ref 3.5–5.1)
SODIUM: 133 mmol/L — AB (ref 135–145)

## 2016-12-31 LAB — GLUCOSE, CAPILLARY
GLUCOSE-CAPILLARY: 112 mg/dL — AB (ref 65–99)
GLUCOSE-CAPILLARY: 119 mg/dL — AB (ref 65–99)
Glucose-Capillary: 124 mg/dL — ABNORMAL HIGH (ref 65–99)
Glucose-Capillary: 126 mg/dL — ABNORMAL HIGH (ref 65–99)
Glucose-Capillary: 96 mg/dL (ref 65–99)
Glucose-Capillary: 99 mg/dL (ref 65–99)

## 2016-12-31 LAB — PHOSPHORUS
Phosphorus: 1.7 mg/dL — ABNORMAL LOW (ref 2.5–4.6)
Phosphorus: 2.1 mg/dL — ABNORMAL LOW (ref 2.5–4.6)

## 2016-12-31 LAB — ECHOCARDIOGRAM COMPLETE
HEIGHTINCHES: 62 in
Weight: 2080 oz

## 2016-12-31 LAB — MAGNESIUM
MAGNESIUM: 1.9 mg/dL (ref 1.7–2.4)
Magnesium: 1.8 mg/dL (ref 1.7–2.4)

## 2016-12-31 LAB — CBC
HCT: 25.7 % — ABNORMAL LOW (ref 36.0–46.0)
HEMOGLOBIN: 8.1 g/dL — AB (ref 12.0–15.0)
MCH: 23.5 pg — ABNORMAL LOW (ref 26.0–34.0)
MCHC: 31.5 g/dL (ref 30.0–36.0)
MCV: 74.7 fL — AB (ref 78.0–100.0)
PLATELETS: 419 10*3/uL — AB (ref 150–400)
RBC: 3.44 MIL/uL — AB (ref 3.87–5.11)
RDW: 17 % — ABNORMAL HIGH (ref 11.5–15.5)
WBC: 8.5 10*3/uL (ref 4.0–10.5)

## 2016-12-31 LAB — TROPONIN I: Troponin I: <0.03 ng/mL (ref <0.03)

## 2016-12-31 LAB — PROCALCITONIN: PROCALCITONIN: 0.98 ng/mL

## 2016-12-31 MED ORDER — SODIUM CHLORIDE 0.9% FLUSH: 10.0000 mL | INTRAVENOUS | Status: DC | PRN

## 2016-12-31 MED ORDER — CHLORHEXIDINE GLUCONATE CLOTH 2 % EX PADS
6.0000 | MEDICATED_PAD | Freq: Every day | CUTANEOUS | Status: DC
  Administered 2017-01-01 – 2017-01-06 (×8): 6 via TOPICAL

## 2016-12-31 MED ORDER — GADOBENATE DIMEGLUMINE 529 MG/ML IV SOLN
10.0000 mL | Freq: Once | INTRAVENOUS | Status: AC | PRN
  Administered 2016-12-31: 10 mL via INTRAVENOUS

## 2016-12-31 MED ORDER — SODIUM CHLORIDE 0.9% FLUSH
10.0000 mL | Freq: Two times a day (BID) | INTRAVENOUS | Status: DC
Start: 2016-12-31 — End: 2017-01-07
  Administered 2016-12-31 – 2017-01-03 (×6): 10 mL
  Administered 2017-01-03: 20 mL
  Administered 2017-01-04: 10 mL
  Administered 2017-01-04 – 2017-01-05 (×2): 20 mL
  Administered 2017-01-05 – 2017-01-07 (×4): 10 mL

## 2016-12-31 MED ORDER — SODIUM CHLORIDE 0.9 % IV SOLN: INTRAVENOUS | Status: DC

## 2016-12-31 NOTE — Progress Notes (Signed)
PULMONARY / CRITICAL CARE MEDICINE   Name: Kristi Durham MRN: [id number] DOB: August 27, 1914    ADMISSION DATE:  12/29/2016 CONSULTATION DATE:  12/31/2016  REFERRING MD:  ED  CHIEF COMPLAINT:  Cardiac arrest  HISTORY OF PRESENT ILLNESS:   History is obtained after speaking to her 2 sons and reviewing her medical record as well as the discharge summary from records from 53/2171. 83 year old presented from home after cardiac arrest. She was at her oncologist's office and was noted to be hypoxic to 90%, was brought back home and soon after sons had fed her via PEG, she developed agonal respirations and then stop breathing. EMS found her pulseless and required CPR with unknown downtime and epinephrine drip via IO before ROSC.  Past medical history of ischemic strokes and subarachnoid hemorrhage in 2015 was status post PEG due to dysphagia and malnutrition, partial complex seizures, colon cancer status post chemotherapy in remission for 8 years. Admission for/2018 [place] for hematemesis, found to have  Mallory-Weiss tear, found to have a sacral decubitus stage III  PAST MEDICAL HISTORY :  She  has a past medical history of Colon cancer ([place]) and Stroke ([place]).  PAST SURGICAL HISTORY: She  has no past surgical history on file.  No Known Allergies  No current facility-administered medications on file prior to encounter.    No current outpatient prescriptions on file prior to encounter.    FAMILY HISTORY:  Her has no family status information on file.    SOCIAL HISTORY: She  reports that she has never smoked. She has quit using smokeless tobacco. She reports that she does not drink alcohol.  REVIEW OF SYSTEMS:   Unable to obtain since unresponsive  SUBJECTIVE:  Off pressors. On weaning trial today  VITAL SIGNS: BP 104/78 (BP Location: Right Arm)   Pulse 93   Temp 98.8 F (37.1 C) (Oral)   Resp (!) 27   Ht 5\' 2"  (1.575 m)   Wt 130 lb (59 kg)   SpO2 100%   BMI 23.78  kg/m   HEMODYNAMICS: CVP:  [1 mmHg-8 mmHg] 1 mmHg  VENTILATOR SETTINGS: Vent Mode: PRVC FiO2 (%):  [40 %] 40 % Set Rate:  [16 bmp] 16 bmp Vt Set:  [350 mL-3350 mL] 3350 mL PEEP:  [5 cmH20] 5 cmH20 Plateau Pressure:  [20 cmH20-24 cmH20] 20 cmH20  INTAKE / OUTPUT: I/O last 3 completed shifts: In: 3682.5 [I.V.:3232.5; NG/GT:300; IV Piggyback:150] Out: 6568 [Urine:1585]  PHYSICAL EXAMINATION: Blood pressure 104/78, pulse 93, temperature 98.8 F (37.1 C), temperature source Oral, resp. rate (!) 27, height 5\' 2"  (1.575 m), weight 130 lb (59 kg), SpO2 100 %. Gen:      No acute distress. Frail, elderly HEENT:  EOMI, sclera anicteric Neck:     No masses; no thyromegaly Lungs:    Clear to auscultation bilaterally; normal respiratory effort CV:         Regular rate and rhythm; no murmurs Abd:      + bowel sounds; soft, non-tender; no palpable masses, no distension Ext:    No edema; adequate peripheral perfusion Skin:      Warm and dry; no rash Neuro: Opens eyes to commands  LABS:  BMET  Recent Labs Lab 12/29/16 1546 12/29/16 1557 12/30/16 0130 12/31/16 0540  NA 128* 127* 126* 133*  K 5.1 4.9 3.7 3.6  CL 93* 89* 92* 99*  CO2 27  --  28 27  BUN 19 23* 16 12  CREATININE 0.49 0.50 0.42* 0.35*  GLUCOSE 195* 194* 131* 122*    Electrolytes  Recent Labs Lab 12/29/16 1546 12/30/16 0130 12/31/16 0540  CALCIUM 7.4* 7.7* 7.8*  MG  --  1.8 1.8  PHOS  --  2.2* 1.7*    CBC  Recent Labs Lab 12/29/16 1546 12/29/16 1557 12/31/16 0511  WBC 11.0*  --  8.5  HGB 8.2* 9.2* 8.1*  HCT 26.3* 27.0* 25.7*  PLT 420*  --  419*    Coag's No results for input(s): APTT, INR in the last 168 hours.  Sepsis Markers  Recent Labs Lab 12/29/16 1558 12/29/16 2013 12/29/16 2015 12/30/16 0130 12/31/16 0540  LATICACIDVEN 4.20* 5.6*  --  1.2  --   PROCALCITON  --   --  0.58  --  0.98    ABG  Recent Labs Lab 12/29/16 1637 12/30/16 0115  PHART 7.635* 7.424  PCO2ART 27.6*  47.2  PO2ART 183.0* 114*    Liver Enzymes  Recent Labs Lab 12/29/16 1546  AST 47*  ALT 29  ALKPHOS 75  BILITOT 0.4  ALBUMIN 1.8*    Cardiac Enzymes  Recent Labs Lab 12/29/16 2015 12/30/16 0130 12/31/16 0540  TROPONINI 0.10* 0.05* <0.03    Glucose  Recent Labs Lab 12/30/16 1156 12/30/16 1529 12/30/16 1921 12/30/16 2344 12/31/16 0335 12/31/16 0744  GLUCAP 91 86 111* 104* 96 99    Imaging  STUDIES:  Head CT 6/26 >> Advanced age-related atrophy, white matter attenuation in left frontal region. Chest x-ray 6/26>> ET tube in place, bilateral basal opacities concerning for pneumonia. MRI head 6/28 > Rt cerebellar infarct, old left frontoparietal infarct. Changes consistent with prior [facility] EEG 6/27 > Diffuse slowing  I have reviewed all images personally.  CULTURES: Bcx 6/26 > GPC, GNR, GPR Sp CX 6/26 >  ANTIBIOTICS: Vanco 6/26 >> Cefepime 6/26 >>  SIGNIFICANT EVENTS:  LINES/TUBES: ETT 6/26 >> Lt IJ 6/27 >> I/O 6/26 >>  DISCUSSION: 83 Y/O with respiratory arrest followed by cardiac arrest- etiology is unclear, could be related to aspiration or seizure, have to rule out acute coronary syndrome chest x-ray shows bibasal consolidation/effusions.   ASSESSMENT / PLAN:  PULMONARY A: Respiratory arrest/acute respiratory failure Aspiration pneumonia P:   PSV weans as tolerated. Continue vent support  CARDIOVASCULAR A:  Shock, likely cardiogenic versus septic EKG shows LAHB & prolonged qT Elevated troponin > trending down Lt arm swelling P:  Follow echo Check vascular US to r/o DVT  RENAL A:   Hyponatremia, chronic P:   Follow lytes  GASTROINTESTINAL A:   Severe protein calorie malnutrition, albumin 1.8 P:   Tube feeds via PEG  HEMATOLOGIC A:   Anemia of chronic disease P:  Follow CBC.   INFECTIOUS A:   Aspiration pneumonia, recent hospitalization 10/2016 P:   Continue vanco, cefepime  ENDOCRINE A:   No issues   P:   SSI  coverage  NEUROLOGIC A:   At risk Anoxic encephalopathy SAH + prior CVAs Acute rt cerebellar stroke P:   Discussed with neuro. Not much to do for her new stroke Continue supportive care.   FAMILY  - Updates: 2 sons updated at bedside 6/27. I spoke with daughter [name] over the phone and updated her on 6/28 about the clinical status including the new CVA.  - Inter-disciplinary family meet or Palliative Care meeting due by:  day 7  The patient is critically ill with multiple organ system failure and requires high complexity decision making for assessment and support, frequent evaluation and titration of  therapies, advanced monitoring, review of radiographic studies and interpretation of complex data.   Critical Care Time devoted to patient care services, exclusive of separately billable procedures, described in this note is 35 minutes.   Marshell Garfinkel MD [facility] Pager [phone] If no answer or after 3pm call: [phone] 12/31/2016, 9:39 AM

## 2016-12-31 NOTE — Progress Notes (Signed)
VASCULAR LAB PRELIMINARY  PRELIMINARY  PRELIMINARY  PRELIMINARY  Left upper extremity venous duplex completed.    Preliminary report:  There is no DVT noted in the left upper extremity.  There is superficial thrombosis noted in the left cephalic and basilic veins.     Daizee Firmin, RVT 12/31/2016, 1:53 PM   ;

## 2016-12-31 NOTE — Progress Notes (Signed)
  Echocardiogram 2D Echocardiogram has been performed.  Kristi Durham M 12/31/2016, 1:13 PM

## 2016-12-31 NOTE — Consult Note (Signed)
NEURO HOSPITALIST CONSULT NOTE   Requestig physician: Dr. Vaughan Browner   Reason for Consult:Prognosis and stroke on MRI   History obtained from:  Chart  HPI:                                                                                                                                          Kristi Durham is an 83 y.o. female with hx as below presented from home after cardiac arrest. She was at her oncologist's office and was noted to be hypoxic to 90%, was brought back home and soon after sons had fed her via PEG, she developed agonal respirations and then stop breathing. EMS found her pulseless and required CPR with unknown downtime and epinephrine drip via IO before ROSC.     Past Medical History:  Diagnosis Date  . Colon cancer ([place])   . Stroke [facility])     History reviewed. No pertinent surgical history.  History reviewed. No pertinent family history.    Social History:  reports that she has never smoked. She has quit using smokeless tobacco. She reports that she does not drink alcohol. Her drug history is not on file.  No Known Allergies  MEDICATIONS:                                                                                                                     I have reviewed the patient's current medications.   ROS:                                                                                                                                       History obtained from chart review  General ROS: Unable to  assess   Blood pressure 134/81, pulse 95, temperature 98.8 F (37.1 C), temperature source Oral, resp. rate (!) 26, height 5\' 2"  (1.575 m), weight 59 kg (130 lb), SpO2 100 %.   Neurologic Examination:                                                                                                      HEENT-  Normocephalic, no lesions, without obvious abnormality.  Normal external eye and conjunctiva.  Normal TM's bilaterally.  Normal  auditory canals and external ears. Normal external nose, mucus membranes and septum.  Normal pharynx. Cardiovascular- regular rate and rhythm, S1, S2 normal, no murmur, click, rub or gallop, pulses palpable throughout   Lungs- chest clear, no wheezing, rales, normal symmetric air entry, Heart exam - S1, S2 normal, no murmur, no gallop, rate regular Abdomen- soft, non-tender; bowel sounds normal; no masses,  no organomegaly   Neurological Examination Mental Status: Intubated not sedated Drowsy but opens eyes to voice Can intermittently nod her head yes or no but not consistently accurate PERRL Extremities contracted at baseline Able to have 3/5 strength in her arms and able to open her hand on the Left, R hand is contracted  Able to wiggle toes to noxious stimuli      Lab Results: Basic Metabolic Panel:  Recent Labs Lab 12/29/16 1546 12/29/16 1557 12/30/16 0130 12/31/16 0540  NA 128* 127* 126* 133*  K 5.1 4.9 3.7 3.6  CL 93* 89* 92* 99*  CO2 27  --  28 27  GLUCOSE 195* 194* 131* 122*  BUN 19 23* 16 12  CREATININE 0.49 0.50 0.42* 0.35*  CALCIUM 7.4*  --  7.7* 7.8*  MG  --   --  1.8 1.8  PHOS  --   --  2.2* 1.7*    Liver Function Tests:  Recent Labs Lab 12/29/16 1546  AST 47*  ALT 29  ALKPHOS 75  BILITOT 0.4  PROT 5.5*  ALBUMIN 1.8*   No results for input(s): LIPASE, AMYLASE in the last 168 hours.  Recent Labs Lab 12/29/16 1613  AMMONIA 9    CBC:  Recent Labs Lab 12/29/16 1546 12/29/16 1557 12/31/16 0511  WBC 11.0*  --  8.5  NEUTROABS 10.2*  --   --   HGB 8.2* 9.2* 8.1*  HCT 26.3* 27.0* 25.7*  MCV 76.2*  --  74.7*  PLT 420*  --  419*    Cardiac Enzymes:  Recent Labs Lab 12/29/16 2015 12/30/16 0130 12/31/16 0540  TROPONINI 0.10* 0.05* <0.03    Lipid Panel: No results for input(s): CHOL, TRIG, HDL, CHOLHDL, VLDL, LDLCALC in the last 168 hours.  CBG:  Recent Labs Lab 12/30/16 1529 12/30/16 1921 12/30/16 2344 12/31/16 0335  12/31/16 0744  GLUCAP 86 111* 104* 96 99    Microbiology: Results for orders placed or performed during the hospital encounter of 12/29/16  Blood Culture (routine x 2)     Status: None (Preliminary result)   Collection Time: 12/29/16  5:00 PM  Result Value Ref Range Status   Specimen Description BLOOD  LEFT FOREARM  Final   Special Requests IN PEDIATRIC BOTTLE Blood Culture adequate volume  Final   Culture NO GROWTH < 24 HOURS  Final   Report Status PENDING  Incomplete  Urine culture     Status: Abnormal   Collection Time: 12/29/16  5:48 PM  Result Value Ref Range Status   Specimen Description Urine  Final   Special Requests NONE  Final   Culture 80,000 COLONIES/mL YEAST (A)  Final   Report Status 12/30/2016 FINAL  Final  MRSA PCR Screening     Status: None   Collection Time: 12/29/16  6:59 PM  Result Value Ref Range Status   MRSA by PCR NEGATIVE NEGATIVE Final    Comment:        The GeneXpert MRSA Assay (FDA approved for NASAL specimens only), is one component of a comprehensive MRSA colonization surveillance program. It is not intended to diagnose MRSA infection nor to guide or monitor treatment for MRSA infections.   Culture, respiratory (NON-Expectorated)     Status: None (Preliminary result)   Collection Time: 12/29/16  7:55 PM  Result Value Ref Range Status   Specimen Description TRACHEAL ASPIRATE  Final   Special Requests NONE  Final   Gram Stain   Final    ABUNDANT WBC PRESENT,BOTH PMN AND MONONUCLEAR FEW GRAM POSITIVE RODS FEW GRAM NEGATIVE RODS FEW GRAM POSITIVE COCCI IN PAIRS    Culture CULTURE REINCUBATED FOR BETTER GROWTH  Final   Report Status PENDING  Incomplete  Blood Culture (routine x 2)     Status: None (Preliminary result)   Collection Time: 12/29/16  8:15 PM  Result Value Ref Range Status   Specimen Description BLOOD LEFT ARM  Final   Special Requests   Final    BOTTLES DRAWN AEROBIC ONLY Blood Culture results may not be optimal due to an  inadequate volume of blood received in culture bottles   Culture NO GROWTH < 24 HOURS  Final   Report Status PENDING  Incomplete    Coagulation Studies: No results for input(s): LABPROT, INR in the last 72 hours.  Imaging: Ct Head Wo Contrast  Result Date: 12/29/2016 CLINICAL DATA:  Respiratory arrest.  History of stroke. EXAM: CT HEAD WITHOUT CONTRAST TECHNIQUE: Contiguous axial images were obtained from the base of the skull through the vertex without intravenous contrast. COMPARISON:  None. FINDINGS: Brain: There is no evidence for acute hemorrhage, hydrocephalus, mass lesion, or abnormal extra-axial fluid collection. No definite CT evidence for acute infarction. Diffuse loss of parenchymal volume is consistent with atrophy. Patchy low attenuation in the deep hemispheric and periventricular white matter is nonspecific, but likely reflects chronic microvascular ischemic demyelination. White matter hypoattenuation high left frontal lobe may be post ischemic but there appears to be some mass-effect and preservation of the overlying cortex. As such, vasogenic edema is a concern. Vascular: No hyperdense vessel or unexpected calcification. Skull: No evidence for fracture. No worrisome lytic or sclerotic lesion. Sinuses/Orbits: The visualized paranasal sinuses and mastoid air cells are clear. Visualized portions of the globes and intraorbital fat are unremarkable. Other: None. IMPRESSION: 1. Advanced atrophy and chronic small vessel white matter ischemic disease. White matter hypo attenuation in the high left frontal region is identified with apparent preservation of the overlying cortex, raising concern for vasogenic edema. As underlying mass lesion would be a consideration, MRI without and with contrast would be the study of choice to further evaluate. Electronically Signed   By: Verda Cumins.D.  On: 12/29/2016 19:20   Mr Jeri Cos MO Contrast  Result Date: 12/31/2016 CLINICAL DATA:  83 y/o  F;  cardiac arrest.  Abnormal CT of head. EXAM: MRI HEAD WITHOUT AND WITH CONTRAST TECHNIQUE: Multiplanar, multiecho pulse sequences of the brain and surrounding structures were obtained without and with intravenous contrast. CONTRAST:  53mL MULTIHANCE GADOBENATE DIMEGLUMINE 529 MG/ML IV SOLN COMPARISON:  12/29/2016 CT of the head. FINDINGS: Brain: Punctate focus of reduced diffusion and right cerebellar hemisphere. Small chronic hemorrhagic infarct in the left posterior frontal and parietal lobes. There is hemosiderin staining of sulci along the left paramedian frontal and parietal convexities with few additional scattered foci in left sylvian fissure and in the right parietal lobe compatible sequelae prior subarachnoid hemorrhage. There are advanced chronic microvascular ischemic changes of white matter and brain parenchymal volume loss. There are small chronic infarcts within the right cerebellar hemisphere. Scattered punctate foci of susceptibility in the basal ganglia, right frontal, occipital, and left temporal lobes as well as right cerebellar hemisphere are compatible with hemosiderin deposition of old microhemorrhage. Vascular: Normal flow voids. Skull and upper cervical spine: Normal marrow signal. Sinuses/Orbits: Mild frontal and ethmoid sinus mucosal thickening. Small mucous retention cyst in the left maxillary sinus. Trace left mastoid effusion. Bilateral intra-ocular lens replacement. Other: None. IMPRESSION: 1. Punctate acute/early subacute infarction in the right cerebellar hemisphere. 2. Advanced chronic microvascular ischemic changes of white matter and brain parenchymal volume loss. 3. Chronic left frontoparietal junction hemorrhagic infarction. This corresponds to region of hypoattenuation on prior CT. 4. Superficial siderosis along the left paramedian frontal and parietal lobes compatible prior subarachnoid hemorrhage. Electronically Signed   By: Kristine Garbe M.D.   On: 12/31/2016 02:48    Dg Abdomen Peg Tube Location  Result Date: 12/30/2016 CLINICAL DATA:  PEG and orogastric tubes. EXAM: ABDOMEN - 1 VIEW COMPARISON:  Chest x-ray 12/30/2016. FINDINGS: Orogastric tube noted in the stomach. Peg tube noted in the stomach. Contrast noted in the stomach, duodenum, and proximal small bowel. No bowel distention. Stool noted throughout the colon. Basilar atelectasis with bilateral pleural effusions. IMPRESSION: 1. PEG and orogastric tubes noted in the stomach. No gastric distention. Oral contrast in the stomach, duodenum, and proximal small bowel. 2. Basilar atelectasis and bilateral pleural effusions. Electronically Signed   By: Marcello Moores  Register   On: 12/30/2016 17:18   Dg Chest Port 1 View  Result Date: 12/31/2016 CLINICAL DATA:  Intubation. EXAM: PORTABLE CHEST 1 VIEW COMPARISON:  12/30/2016 . FINDINGS: Endotracheal tube, right IJ line, NG tube in stable position. Stable cardiomegaly. Diffuse bilateral pulmonary infiltrates/edema again noted. Left pleural effusion again noted and unchanged. Interim improvement of right pleural effusion. IMPRESSION: 1. Lines and tubes in stable position. 2. Cardiomegaly with bilateral pulmonary infiltrates/ edema again noted without significant change. Stable left-sided pleural effusion. Improved right pleural effusion Electronically Signed   By: Marcello Moores  Register   On: 12/31/2016 07:45   Dg Chest Port 1 View  Result Date: 12/30/2016 CLINICAL DATA:  Respiratory failure, aspiration, intubated patient. Status post cardiac arrest. EXAM: PORTABLE CHEST 1 VIEW COMPARISON:  Portable chest x-ray of December 30, 2016 FINDINGS: The patient remains rotated on today's study. The right lung is well-expanded. There is hazy increased density in the lower 1/2 of the right hemithorax which likely reflects pleural fluid layering posteriorly. On the left there is a small pleural effusion laterally. The retrocardiac region remains dense. The heart is mildly enlarged. The pulmonary  vascularity is not clearly engorged. The endotracheal tube tip  lies 1.9 cm above the carina. The esophagogastric tube tip projects below the inferior margin of the image. The right internal jugular venous catheter tip projects over the midportion of the SVC. There is calcification in the wall of the aortic arch. IMPRESSION: Persistent bibasilar atelectasis or pneumonia with small bilateral pleural effusions. Cardiomegaly without significant pulmonary vascular congestion. The support tubes are in reasonable position. Electronically Signed   By: David  Martinique M.D.   On: 12/30/2016 07:57   Dg Chest Port 1 View  Result Date: 12/30/2016 CLINICAL DATA:  83 y/o  F; central line placement. EXAM: PORTABLE CHEST 1 VIEW COMPARISON:  12/29/2016 chest radiograph FINDINGS: Stable mildly enlarged cardiac silhouette. Aortic atherosclerosis with calcification. Stable endotracheal to 1.7 cm from the carina. Enteric tube tip below the field of view in the abdomen. Right central venous catheter tip projects over mid SVC. Bibasilar opacities are stable compatible with small effusions and associated atelectasis/consolidation. IMPRESSION: 1. Right central venous catheter tip projects over mid SVC. Other lines and tubes are stable. 2. Small bilateral effusions and bibasilar atelectasis/consolidations are stable. 3. Stable mild cardiomegaly. Electronically Signed   By: Kristine Garbe M.D.   On: 12/30/2016 01:27   Dg Chest Portable 1 View  Result Date: 12/29/2016 CLINICAL DATA:  Respiratory distress.  Post intubation. EXAM: PORTABLE CHEST 1 VIEW COMPARISON:  None. FINDINGS: An endotracheal tube is identified 1.5 cm above carina, directed toward the right mainstem bronchus. An NG tube is present entering the abdomen with tip off the field of view. Cardiomegaly is noted. Bilateral pleural effusions and bibasilar atelectasis/consolidation noted. There is no evidence of pneumothorax. IMPRESSION: Bilateral pleural effusions and  bibasilar atelectasis/ consolidation. Support apparatus as described. Cardiomegaly. Electronically Signed   By: Margarette Canada M.D.   On: 12/29/2016 15:39        Assessment/Pl  83 y.o. female with hx as below presented from home after cardiac arrest. She was at her oncologist's office and was noted to be hypoxic to 90%, was brought back home and soon after sons had fed her via PEG, she developed agonal respirations and then stop breathing. EMS found her pulseless and required CPR with unknown downtime and epinephrine drip via IO before ROSC.  medical history significant for ischemic strokes and subarachnoid hemorrhage in 2015 was status post PEG due to dysphagia and malnutrition, partial complex seizures, colon cancer status post chemotherapy in remission for 8 years. Admission for/2018 [place] for hematemesis, found to have  Mallory-Weiss tear, found to have a sacral decubitus stage III   MRI brain consistent with advanced atrophy and microvascular disease along with a small acute R cerebellar stroke  Given her very disabled baseline, this new stroke should not affect her overall motor or cognitive outcome but does not take away from her overall poor prognosis from a quality of life standpoint.  If family wants to pursue aggressive care, stroke workup as follows  - CTA head and neck - Echo - Aspirin 81mg  daily - SBP < 160 - Depending on above workup, further workup may be warranted

## 2016-12-31 NOTE — Progress Notes (Signed)
Pt. Transported to/from MRI w/o complications, RT to monitor.

## 2017-01-01 DIAGNOSIS — I6340 Cerebral infarction due to embolism of unspecified cerebral artery: Secondary | ICD-10-CM | POA: Insufficient documentation

## 2017-01-01 DIAGNOSIS — I609 Nontraumatic subarachnoid hemorrhage, unspecified: Secondary | ICD-10-CM

## 2017-01-01 DIAGNOSIS — L8990 Pressure ulcer of unspecified site, unspecified stage: Secondary | ICD-10-CM | POA: Insufficient documentation

## 2017-01-01 DIAGNOSIS — J9601 Acute respiratory failure with hypoxia: Secondary | ICD-10-CM

## 2017-01-01 DIAGNOSIS — I619 Nontraumatic intracerebral hemorrhage, unspecified: Secondary | ICD-10-CM | POA: Insufficient documentation

## 2017-01-01 DIAGNOSIS — I6330 Cerebral infarction due to thrombosis of unspecified cerebral artery: Secondary | ICD-10-CM | POA: Insufficient documentation

## 2017-01-01 LAB — CBC
HEMATOCRIT: 23 % — AB (ref 36.0–46.0)
HEMOGLOBIN: 7.2 g/dL — AB (ref 12.0–15.0)
MCH: 23.4 pg — AB (ref 26.0–34.0)
MCHC: 31.3 g/dL (ref 30.0–36.0)
MCV: 74.7 fL — AB (ref 78.0–100.0)
PLATELETS: 385 10*3/uL (ref 150–400)
RBC: 3.08 MIL/uL — AB (ref 3.87–5.11)
RDW: 17.1 % — ABNORMAL HIGH (ref 11.5–15.5)
WBC: 7.6 10*3/uL (ref 4.0–10.5)

## 2017-01-01 LAB — GLUCOSE, CAPILLARY
GLUCOSE-CAPILLARY: 112 mg/dL — AB (ref 65–99)
GLUCOSE-CAPILLARY: 132 mg/dL — AB (ref 65–99)
GLUCOSE-CAPILLARY: 165 mg/dL — AB (ref 65–99)
GLUCOSE-CAPILLARY: 107 mg/dL — AB (ref 65–99)
GLUCOSE-CAPILLARY: 116 mg/dL — AB (ref 65–99)

## 2017-01-01 LAB — BASIC METABOLIC PANEL
Anion gap: 5 (ref 5–15)
BUN: 13 mg/dL (ref 6–20)
CALCIUM: 8.1 mg/dL — AB (ref 8.9–10.3)
CO2: 30 mmol/L (ref 22–32)
CREATININE: 0.34 mg/dL — AB (ref 0.44–1.00)
Chloride: 101 mmol/L (ref 101–111)
Glucose, Bld: 159 mg/dL — ABNORMAL HIGH (ref 65–99)
Potassium: 3.2 mmol/L — ABNORMAL LOW (ref 3.5–5.1)
SODIUM: 136 mmol/L (ref 135–145)

## 2017-01-01 LAB — LIPID PANEL
Cholesterol: 62 mg/dL (ref 0–200)
HDL: 22 mg/dL — ABNORMAL LOW (ref >40)
LDL CALC: 31 mg/dL (ref 0–99)
Total CHOL/HDL Ratio: 2.8 RATIO
Triglycerides: 45 mg/dL (ref <150)
VLDL: 9 mg/dL (ref 0–40)

## 2017-01-01 LAB — CULTURE, RESPIRATORY W GRAM STAIN: Culture: NORMAL

## 2017-01-01 LAB — CULTURE, RESPIRATORY

## 2017-01-01 LAB — PROCALCITONIN: Procalcitonin: 0.65 ng/mL

## 2017-01-01 MED ORDER — POTASSIUM CHLORIDE CRYS ER 20 MEQ PO TBCR
40.0000 meq | EXTENDED_RELEASE_TABLET | Freq: Once | ORAL | Status: AC
Start: 2017-01-01 — End: 2017-01-01
  Administered 2017-01-01: 40 meq via ORAL
  Filled 2017-01-01: qty 2

## 2017-01-01 MED ORDER — FUROSEMIDE 10 MG/ML IJ SOLN
20.0000 mg | Freq: Every day | INTRAMUSCULAR | Status: DC
  Administered 2017-01-01: 20 mg via INTRAVENOUS
  Filled 2017-01-01: qty 2

## 2017-01-01 MED ORDER — FENTANYL CITRATE (PF) 100 MCG/2ML IJ SOLN: 12.5000 ug | INTRAMUSCULAR | Status: DC | PRN

## 2017-01-01 MED ORDER — POTASSIUM CHLORIDE 20 MEQ/15ML (10%) PO SOLN: 20.0000 meq | ORAL | Status: DC

## 2017-01-01 NOTE — Progress Notes (Signed)
PULMONARY / CRITICAL CARE MEDICINE   Name: Kristi Durham MRN: [id number] DOB: 1914-09-21    ADMISSION DATE:  12/29/2016 CONSULTATION DATE:  01/01/2017  REFERRING MD:  ED  CHIEF COMPLAINT:  Cardiac arrest  HISTORY OF PRESENT ILLNESS:   History is obtained after speaking to her 2 sons and reviewing her medical record as well as the discharge summary from records from 29/4837. 83 year old presented from home after cardiac arrest. She was at her oncologist's office and was noted to be hypoxic to 90%, was brought back home and soon after sons had fed her via PEG, she developed agonal respirations and then stop breathing. EMS found her pulseless and required CPR with unknown downtime and epinephrine drip via IO before ROSC.  Past medical history of ischemic strokes and subarachnoid hemorrhage in 2015 was status post PEG due to dysphagia and malnutrition, partial complex seizures, colon cancer status post chemotherapy in remission for 8 years. Admission for/2018 [place] for hematemesis, found to have  Mallory-Weiss tear, found to have a sacral decubitus stage III  PAST MEDICAL HISTORY :  She  has a past medical history of Colon cancer ([place]) and Stroke ([place]).  PAST SURGICAL HISTORY: She  has no past surgical history on file.  No Known Allergies  No current facility-administered medications on file prior to encounter.    No current outpatient prescriptions on file prior to encounter.    FAMILY HISTORY:  Her has no family status information on file.    SOCIAL HISTORY: She  reports that she has never smoked. She has quit using smokeless tobacco. She reports that she does not drink alcohol.  REVIEW OF SYSTEMS:   Unable to obtain since unresponsive  SUBJECTIVE:  Off pressors. On weaning trial today  VITAL SIGNS: BP 122/71 (BP Location: Left Leg)   Pulse 96   Temp 98.6 F (37 C) (Oral)   Resp 18   Ht 5\' 2"  (1.575 m)   Wt 115 lb (52.2 kg)   SpO2 97%   BMI 21.03 kg/m    HEMODYNAMICS: CVP:  [5 mmHg-13 mmHg] 7 mmHg  VENTILATOR SETTINGS: Vent Mode: CPAP;PSV FiO2 (%):  [40 %] 40 % Set Rate:  [16 bmp] 16 bmp Vt Set:  [350 mL] 350 mL PEEP:  [5 cmH20] 5 cmH20 Pressure Support:  [10 cmH20-12 cmH20] 10 cmH20 Plateau Pressure:  [22 cmH20-25 cmH20] 22 cmH20  INTAKE / OUTPUT: I/O last 3 completed shifts: In: 3678.3 [I.V.:2027.5; NG/GT:1500.8; IV Piggyback:150] Out: 1695 [Urine:1695]  PHYSICAL EXAMINATION: Blood pressure 104/78, pulse 93, temperature 98.8 F (37.1 C), temperature source Oral, resp. rate (!) 27, height 5\' 2"  (1.575 m), weight 130 lb (59 kg), SpO2 100 %. Gen:      No acute distress. Frail, elderly HEENT:  EOMI, sclera anicteric Neck:     No masses; no thyromegaly Lungs:    Clear to auscultation bilaterally; normal respiratory effort CV:         Regular rate and rhythm; no murmurs Abd:      + bowel sounds; soft, non-tender; no palpable masses, no distension Ext:    No edema; adequate peripheral perfusion Skin:      Warm and dry; no rash Neuro: Opens eyes to commands  LABS:  BMET  Recent Labs Lab 12/29/16 1546 12/29/16 1557 12/30/16 0130 12/31/16 0540  NA 128* 127* 126* 133*  K 5.1 4.9 3.7 3.6  CL 93* 89* 92* 99*  CO2 27  --  28 27  BUN 19 23* 16 12  CREATININE 0.49 0.50 0.42* 0.35*  GLUCOSE 195* 194* 131* 122*    Electrolytes  Recent Labs Lab 12/29/16 1546 12/30/16 0130 12/31/16 0540 12/31/16 1803  CALCIUM 7.4* 7.7* 7.8*  --   MG  --  1.8 1.8 1.9  PHOS  --  2.2* 1.7* 2.1*    CBC  Recent Labs Lab 12/29/16 1546 12/29/16 1557 12/31/16 0511 01/01/17 0337  WBC 11.0*  --  8.5 7.6  HGB 8.2* 9.2* 8.1* 7.2*  HCT 26.3* 27.0* 25.7* 23.0*  PLT 420*  --  419* 385    Coag's No results for input(s): APTT, INR in the last 168 hours.  Sepsis Markers  Recent Labs Lab 12/29/16 1558 12/29/16 2013 12/29/16 2015 12/30/16 0130 12/31/16 0540 01/01/17 0337  LATICACIDVEN 4.20* 5.6*  --  1.2  --   --   PROCALCITON   --   --  0.58  --  0.98 0.65    ABG  Recent Labs Lab 12/29/16 1637 12/30/16 0115  PHART 7.635* 7.424  PCO2ART 27.6* 47.2  PO2ART 183.0* 114*    Liver Enzymes  Recent Labs Lab 12/29/16 1546  AST 47*  ALT 29  ALKPHOS 75  BILITOT 0.4  ALBUMIN 1.8*    Cardiac Enzymes  Recent Labs Lab 12/29/16 2015 12/30/16 0130 12/31/16 0540  TROPONINI 0.10* 0.05* <0.03    Glucose  Recent Labs Lab 12/31/16 1141 12/31/16 1613 12/31/16 1914 12/31/16 2334 01/01/17 0342 01/01/17 0747  GLUCAP 126* 112* 119* 124* 112* 132*    Imaging  STUDIES:  Head CT 6/26 >> Advanced age-related atrophy, white matter attenuation in left frontal region. Chest x-ray 6/26>> ET tube in place, bilateral basal opacities concerning for pneumonia. MRI head 6/28 > Rt cerebellar infarct, old left frontoparietal infarct. Changes consistent with prior [facility] EEG 6/27 > Diffuse slowing  Echo 6/28- LVEF 51-88%, grade 1 diastolic dysfunction, moderate pulmonary HTN UE [place] 6/27- No DVT I have reviewed all images personally.  CULTURES: Bcx 6/26 >  Sp CX 6/26 > GPC, GNR, GPR  ANTIBIOTICS: Vanco 6/26 >> 6/29 Cefepime 6/26 >>  SIGNIFICANT EVENTS:  LINES/TUBES: ETT 6/26 >> Lt IJ 6/27 >> I/O 6/26 >> 6/26  DISCUSSION: 83 Y/O with respiratory arrest followed by cardiac arrest- etiology is unclear, could be related to aspiration or seizure, have to rule out acute coronary syndrome chest x-ray shows bibasal consolidation/effusions.   ASSESSMENT / PLAN:  PULMONARY A: Respiratory arrest/acute respiratory failure Aspiration pneumonia P:   PSV weans as tolerated Follow CXR  CARDIOVASCULAR A:  Shock, likely cardiogenic versus septic EKG shows LAHB & prolonged qT Elevated troponin > trending down Lt arm swelling. No EVT P:  Start lasix for diuresis  RENAL A:   Hyponatremia, chronic P:   Follow lytes  GASTROINTESTINAL A:   Severe protein calorie malnutrition, albumin 1.8 P:   Tube feeds  via PEG  HEMATOLOGIC A:   Anemia of chronic disease P:  Follow CBC.   INFECTIOUS A:   Aspiration pneumonia, recent hospitalization 10/2016 P:   Continue cefepime DC vanco Follow cultures  ENDOCRINE A:   No issues   P:   SSI coverage  NEUROLOGIC A:   At risk Anoxic encephalopathy SAH + prior CVAs Acute rt cerebellar stroke P:   Discussed with neuro. Not much to do for her new stroke Continue supportive care.   FAMILY  - Updates: 2 sons updated at bedside 6/27. I spoke with daughter [name] over the phone 6/29 and updated her on 6/28 about the clinical  status including the new CVA.  - Inter-disciplinary family meet or Palliative Care meeting due by:  day 7  The patient is critically ill with multiple organ system failure and requires high complexity decision making for assessment and support, frequent evaluation and titration of therapies, advanced monitoring, review of radiographic studies and interpretation of complex data.   Critical Care Time devoted to patient care services, exclusive of separately billable procedures, described in this note is 40 minutes.   Marshell Garfinkel MD [facility] Pager [phone] If no answer or after 3pm call: [phone] 01/01/2017, 9:26 AM

## 2017-01-01 NOTE — Progress Notes (Signed)
Pharmacy Antibiotic Note  Kristi Durham is a 83 y.o. female admitted on 12/29/2016 with aspiration PNA, recent hospitalization.  Pharmacy has been consulted for cefepime dosing.  Vancomycin stopped today.  Renal function fairly stable, cultures negative thus far.  Plan: 1. Continue cefepime 1g IV q 24 hrs. 2. F/u renal function, cultures and length of threapy.  Height: 5\' 2"  (157.5 cm) Weight: 115 lb (52.2 kg) IBW/kg (Calculated) : 50.1  Temp (24hrs), Avg:98.8 F (37.1 C), Min:98.5 F (36.9 C), Max:99.5 F (37.5 C)   Recent Labs Lab 12/29/16 1546 12/29/16 1557 12/29/16 1558 12/29/16 2013 12/30/16 0130 12/31/16 0511 12/31/16 0540 01/01/17 0337 01/01/17 0934  WBC 11.0*  --   --   --   --  8.5  --  7.6  --   CREATININE 0.49 0.50  --   --  0.42*  --  0.35*  --  0.34*  LATICACIDVEN  --   --  4.20* 5.6* 1.2  --   --   --   --     Estimated Creatinine Clearance: 30.3 mL/min (A) (by C-G formula based on SCr of 0.34 mg/dL (L)).    No Known Allergies   Antimicrobials this admission: 6/26 Vanc >> 6/29 6/26 cefepime >>   Dose adjustments this admission: n/a   Microbiology results:  6/26 Trach asp> reincubated 6/26 MRSA PCR > neg 6/26  BCx x 2 > ngtd 6/26 UCx 80K yeast  Thank you for allowing pharmacy to be a part of this patient's care.  Uvaldo Rising, BCPS  Clinical Pharmacist Pager [phone]  01/01/2017 11:06 AM

## 2017-01-01 NOTE — Progress Notes (Signed)
STROKE TEAM PROGRESS NOTE   HISTORY OF PRESENT ILLNESS (per record) Kristi Durham is an 83 y.o. female with a history of colon cancer (in remission) and subarachnoid hemorrhage in 2015 was status post PEG due to dysphagia and malnutrition, partial complex seizures, colon cancer status post chemotherapy in remission for 8 years.  Bed bound for past six months due to stage III sacral decubitus.  The patient presents with from home witnessed cardiac arrest.  She was at her oncologist's office and was noted to be hypoxic to 90%, was brought back home and soon after sons had fed her via PEG,she developed agonal respirations and then stop breathing. EMS found her pulseless and unknown downtime, started CPR and an epinephrine drip via IO before ROSC.  She was admitted 12/29/2016 with aspiration pneumonia.  ABG 6/27 with mild metabolic alkalosis, lactic acid 4.2->5.6->1.2, unremarkable ammonia and toxicology, pan cultures pending.  CXR 6/28 with bibasilar infiltrates/edema.  CT head with left frontal white matter hypo-attenuation.  MRI brain from 12/31/16 with punctate acute/early subacute infarction in the right cerebellar hemisphere, chronic left frontoparietal junction hemorrhagic infarction, and siderosis from 2015 subarachnoid hemorrhage.  Confirmed full code at this time.  Patient was not administered IV t-PA secondary to being outside of the treatment window. She was admitted to the Cardiac ICU for further evaluation and treatment.   SUBJECTIVE (INTERVAL HISTORY) Her son is at the beside.  The patient is intubated, but opens eyes to voice and follows all commands appropriately.   OBJECTIVE Temp:  [98.5 F (36.9 C)-99.5 F (37.5 C)] 98.5 F (36.9 C) (06/29 1117) Pulse Rate:  [85-103] 100 (06/29 1100) Cardiac Rhythm: Sinus tachycardia (06/29 1100) Resp:  [16-25] 18 (06/29 0400) BP: (84-155)/(56-112) 113/72 (06/29 1100) SpO2:  [93 %-100 %] 100 % (06/29 1120) FiO2 (%):  [40 %] 40 % (06/29  1120) Weight:  [52.2 kg (115 lb)-54.4 kg (120 lb)] 52.2 kg (115 lb) (06/29 0500)  CBC:  Recent Labs Lab 12/29/16 1546  12/31/16 0511 01/01/17 0337  WBC 11.0*  --  8.5 7.6  NEUTROABS 10.2*  --   --   --   HGB 8.2*  < > 8.1* 7.2*  HCT 26.3*  < > 25.7* 23.0*  MCV 76.2*  --  74.7* 74.7*  PLT 420*  --  419* 385  < > = values in this interval not displayed.  Basic Metabolic Panel:  Recent Labs Lab 12/31/16 0540 12/31/16 1803 01/01/17 0934  NA 133*  --  136  K 3.6  --  3.2*  CL 99*  --  101  CO2 27  --  30  GLUCOSE 122*  --  159*  BUN 12  --  13  CREATININE 0.35*  --  0.34*  CALCIUM 7.8*  --  8.1*  MG 1.8 1.9  --   PHOS 1.7* 2.1*  --     Lipid Panel: No results found for: CHOL, TRIG, HDL, CHOLHDL, VLDL, LDLCALC HgbA1c: No results found for: HGBA1C Urine Drug Screen: No results found for: LABOPIA, COCAINSCRNUR, LABBENZ, AMPHETMU, THCU, LABBARB  Alcohol Level     Component Value Date/Time   ETH <5 12/29/2016 1612    IMAGING  CT Head 12/29/2016 IMPRESSION: 1. Advanced atrophy and chronic small vessel white matter ischemic disease. White matter hypo attenuation in the high left frontal region is identified with apparent preservation of the overlying cortex, raising concern for vasogenic edema. As underlying mass lesion would be a consideration, MRI without and with contrast would be  the study of choice to further evaluate.  Mr Jeri Cos Wo Contrast 12/31/2016 IMPRESSION: 1. Punctate acute/early subacute infarction in the right cerebellar hemisphere. 2. Advanced chronic microvascular ischemic changes of white matter and brain parenchymal volume loss. 3. Chronic left frontoparietal junction hemorrhagic infarction. This corresponds to region of hypoattenuation on prior CT. 4. Superficial siderosis along the left paramedian frontal and parietal lobes compatible prior subarachnoid hemorrhage.  Dg Abdomen Peg Tube Location 12/30/2016 IMPRESSION: 1. PEG and orogastric tubes noted in  the stomach. No gastric distention. Oral contrast in the stomach, duodenum, and proximal small bowel. 2. Basilar atelectasis and bilateral pleural effusions.   Dg Chest Port 1 View 12/31/2016 IMPRESSION: 1. Lines and tubes in stable position. 2. Cardiomegaly with bilateral pulmonary infiltrates/ edema again noted without significant change. Stable left-sided pleural effusion. Improved right pleural effusion    PHYSICAL EXAM   elderly African American lady who is  intubated but not sedated. . Afebrile. Head is nontraumatic. Neck is supple without bruit.    Cardiac exam no murmur or gallop. Lungs are clear to auscultation. Distal pulses are well felt. Neurological Exam :  Patient is intubated and on ventilator. She is drowsy but opens eyes to stimulation easily. She follows simple commands and follows gaze and midline commands. Pupils are irregular but reactive. Fundi could not be visualized. She blinks to threat bilaterally. Mild left lower facial weakness. Tongue midline. Motor system exam she's able to move all 4 extremities purposefully to commands is worse against gravity. Plantars downgoing. Deep tendon flexes symmetric. Sensation and coordination cannot be related tested.  ASSESSMENT/PLAN [name] is a 83 y.o. female with history of  colon cancer (in remission) and subarachnoid hemorrhage in 2015 was status post PEG due to dysphagia and malnutrition, partial complex seizures, colon cancer status post chemotherapy in remission for 8 years presenting with cardiac arrest and acute/early subacute infarction in the right cerebellar hemisphere. She did not receive IV t-PA due to recent MI.    Stroke: acute/early subacute infarction in the right cerebellar white matter likely lacunar following hypotension from cardiac arrest and underlying small vessel disease  Resultant  Likely no deficits but exam limited by intubation  CT head: left frontal white matter hypo-attenuation.  MRI head:   acute/early subacute infarction in the right cerebellar hemisphere; Chronic left frontoparietal junction hemorrhagic infarction. This corresponds to region of hypoattenuation on prior CT. Superficial siderosis along the left paramedian frontal and parietal lobes compatible prior subarachnoid hemorrhage  2D Echo: EF 60-65%. No source of embolus   LDL  pending  HgbA1c pending   SCDs for VTE prophylaxis  Diet NPO time specified  No antithrombotic prior to admission, now on No antithrombotic  Ongoing aggressive stroke risk factor management  Therapy recommendations:  pending Disposition:   pending Hypertension  Stable  Permissive hypertension (OK if < 220/120) but gradually normalize in 5-7 days  Long-term BP goal normotensive  Hyperlipidemia  Home meds: none  LDL pending, goal < 70  Add statin if LDL not at goal  Continue statin at discharge is added during admission  Diabetes  HgbA1c pending, goal < 7.0  Controlled  Other Stroke Risk Factors  Advanced age  Hx stroke/TIA  History of cancer  Other Active Problems  Cardiac arrest  Acute hypoxic respiratory failure  Aspiration pneumonia  Hospital day # 3  I have personally examined this patient, reviewed notes, independently viewed imaging studies, participated in medical decision making and plan of care.ROS completed by me  personally and pertinent positives fully documented  I have made any additions or clarifications directly to the above note.  The patient had a cardiorespiratory arrest and brain MRI shows a tiny right cerebellar white matter infarct etiology likely lacunar will underlying small vessel disease and hypotension precipitating it. Recommend aspirin and extubated if tolerated. Long discussion at the bedside with the patient's family members and with Dr. Vaughan Browner and answered questions.Further stroke workup will depend upon patient's prognosis and goals of care. This patient is critically ill and at  significant risk of neurological worsening, death and care requires constant monitoring of vital signs, hemodynamics,respiratory and cardiac monitoring, extensive review of multiple databases, frequent neurological assessment, discussion with family, other specialists and medical decision making of high complexity.I have made any additions or clarifications directly to the above note.This critical care time does not reflect procedure time, or teaching time or supervisory time of PA/NP/Med Resident etc but could involve care discussion time.  I spent 30 minutes of neurocritical care time  in the care of  this patient.     Antony Contras, MD Medical Director [place] Pager: [phone] 01/01/2017 6:42 PM   To contact Stroke Continuity provider, please refer to [email or web address] After hours, contact General Neurology.

## 2017-01-02 ENCOUNTER — Inpatient Hospital Stay (HOSPITAL_COMMUNITY): Payer: Medicare Other

## 2017-01-02 DIAGNOSIS — Z86718 Personal history of other venous thrombosis and embolism: Secondary | ICD-10-CM

## 2017-01-02 LAB — GLUCOSE, CAPILLARY
GLUCOSE-CAPILLARY: 120 mg/dL — AB (ref 65–99)
GLUCOSE-CAPILLARY: 123 mg/dL — AB (ref 65–99)
GLUCOSE-CAPILLARY: 129 mg/dL — AB (ref 65–99)
Glucose-Capillary: 116 mg/dL — ABNORMAL HIGH (ref 65–99)
Glucose-Capillary: 129 mg/dL — ABNORMAL HIGH (ref 65–99)
Glucose-Capillary: 130 mg/dL — ABNORMAL HIGH (ref 65–99)
Glucose-Capillary: 98 mg/dL (ref 65–99)

## 2017-01-02 LAB — BASIC METABOLIC PANEL
Anion gap: 7 (ref 5–15)
BUN: 17 mg/dL (ref 6–20)
CALCIUM: 8.4 mg/dL — AB (ref 8.9–10.3)
CO2: 32 mmol/L (ref 22–32)
CREATININE: 0.31 mg/dL — AB (ref 0.44–1.00)
Chloride: 98 mmol/L — ABNORMAL LOW (ref 101–111)
GFR calc Af Amer: >60 mL/min (ref >60)
Glucose, Bld: 99 mg/dL (ref 65–99)
POTASSIUM: 3.7 mmol/L (ref 3.5–5.1)
SODIUM: 137 mmol/L (ref 135–145)

## 2017-01-02 LAB — CBC
HCT: 24.4 % — ABNORMAL LOW (ref 36.0–46.0)
Hemoglobin: 7.6 g/dL — ABNORMAL LOW (ref 12.0–15.0)
MCH: 23.7 pg — ABNORMAL LOW (ref 26.0–34.0)
MCHC: 31.1 g/dL (ref 30.0–36.0)
MCV: 76 fL — ABNORMAL LOW (ref 78.0–100.0)
PLATELETS: 402 10*3/uL — AB (ref 150–400)
RBC: 3.21 MIL/uL — AB (ref 3.87–5.11)
RDW: 17.6 % — AB (ref 11.5–15.5)
WBC: 8.1 10*3/uL (ref 4.0–10.5)

## 2017-01-02 LAB — HEMOGLOBIN A1C
HEMOGLOBIN A1C: 5.5 % (ref 4.8–5.6)
Mean Plasma Glucose: 111 mg/dL

## 2017-01-02 MED ORDER — PANTOPRAZOLE SODIUM 40 MG PO PACK
40.0000 mg | PACK | Freq: Every day | ORAL | Status: DC
  Administered 2017-01-02 – 2017-01-06 (×5): 40 mg
  Filled 2017-01-02 (×5): qty 20

## 2017-01-02 MED ORDER — DEXTROSE 5 % IV SOLN
500.0000 mg | INTRAVENOUS | Status: AC
  Administered 2017-01-02 – 2017-01-06 (×5): 500 mg via INTRAVENOUS
  Filled 2017-01-02 (×6): qty 0.5

## 2017-01-02 MED ORDER — FUROSEMIDE 10 MG/ML IJ SOLN
40.0000 mg | Freq: Two times a day (BID) | INTRAMUSCULAR | Status: DC
  Filled 2017-01-02: qty 4

## 2017-01-02 MED ORDER — ASPIRIN 325 MG PO TABS
325.0000 mg | ORAL_TABLET | Freq: Every day | ORAL | Status: DC
  Administered 2017-01-02: 325 mg via ORAL
  Filled 2017-01-02 (×2): qty 1

## 2017-01-02 MED ORDER — FUROSEMIDE 10 MG/ML IJ SOLN
40.0000 mg | Freq: Two times a day (BID) | INTRAMUSCULAR | Status: DC
  Administered 2017-01-02 – 2017-01-03 (×2): 40 mg via INTRAVENOUS
  Filled 2017-01-02: qty 4

## 2017-01-02 NOTE — Progress Notes (Signed)
STROKE TEAM PROGRESS NOTE   SUBJECTIVE (INTERVAL HISTORY) No family is at the beside.  The patient is still intubated, eye closed and resisted for forced opening. Not following commands or opening eyes. On TF.     OBJECTIVE Temp:  [97.2 F (36.2 C)-99 F (37.2 C)] 97.2 F (36.2 C) (06/30 0742) Pulse Rate:  [84-100] 94 (06/30 0839) Cardiac Rhythm: Normal sinus rhythm (06/30 0800) Resp:  [16-19] 16 (06/30 0839) BP: (87-125)/(51-80) 125/75 (06/30 0839) SpO2:  [100 %] 100 % (06/30 0800) FiO2 (%):  [40 %] 40 % (06/30 0839) Weight:  [49 kg (108 lb)] 49 kg (108 lb) (06/30 0400)  CBC:  Recent Labs Lab 12/29/16 1546  01/01/17 0337 01/02/17 0337  WBC 11.0*  < > 7.6 8.1  NEUTROABS 10.2*  --   --   --   HGB 8.2*  < > 7.2* 7.6*  HCT 26.3*  < > 23.0* 24.4*  MCV 76.2*  < > 74.7* 76.0*  PLT 420*  < > 385 402*  < > = values in this interval not displayed.  Basic Metabolic Panel:   Recent Labs Lab 12/31/16 0540 12/31/16 1803 01/01/17 0934 01/02/17 0337  NA 133*  --  136 137  K 3.6  --  3.2* 3.7  CL 99*  --  101 98*  CO2 27  --  30 32  GLUCOSE 122*  --  159* 99  BUN 12  --  13 17  CREATININE 0.35*  --  0.34* 0.31*  CALCIUM 7.8*  --  8.1* 8.4*  MG 1.8 1.9  --   --   PHOS 1.7* 2.1*  --   --     Lipid Panel:     Component Value Date/Time   CHOL 62 01/01/2017 1330   TRIG 45 01/01/2017 1330   HDL 22 (L) 01/01/2017 1330   CHOLHDL 2.8 01/01/2017 1330   VLDL 9 01/01/2017 1330   LDLCALC 31 01/01/2017 1330   HgbA1c:  Lab Results  Component Value Date   HGBA1C 5.5 01/01/2017   Urine Drug Screen: No results found for: LABOPIA, COCAINSCRNUR, LABBENZ, AMPHETMU, THCU, LABBARB  Alcohol Level     Component Value Date/Time   ETH <5 12/29/2016 1612    IMAGING I have personally reviewed the radiological images below and agree with the radiology interpretations.  CT Head 12/29/2016 1. Advanced atrophy and chronic small vessel white matter ischemic disease. White matter hypo  attenuation in the high left frontal region is identified with apparent preservation of the overlying cortex, raising concern for vasogenic edema. As underlying mass lesion would be a consideration, MRI without and with contrast would be the study of choice to further evaluate.  Mr Kizzie Fantasia Contrast 12/31/2016 1. Punctate acute/early subacute infarction in the right cerebellar hemisphere.  2. Advanced chronic microvascular ischemic changes of white matter and brain parenchymal volume loss.  3. Chronic left frontoparietal junction hemorrhagic infarction. This corresponds to region of hypoattenuation on prior CT.  4. Superficial siderosis along the left paramedian frontal and parietal lobes compatible prior subarachnoid hemorrhage.  Dg Abdomen Peg Tube Location 12/30/2016 1. PEG and orogastric tubes noted in the stomach. No gastric distention. Oral contrast in the stomach, duodenum, and proximal small bowel.  2. Basilar atelectasis and bilateral pleural effusions.   Dg Chest Port 1 View 12/31/2016 1. Lines and tubes in stable position.  2. Cardiomegaly with bilateral pulmonary infiltrates/ edema again noted without significant change. Stable left-sided pleural effusion. Improved right pleural effusion  TTE - - Left ventricle: The cavity size was normal. Systolic function was   normal. The estimated ejection fraction was in the range of 60%   to 65%. Wall motion was normal; there were no regional wall   motion abnormalities. Doppler parameters are consistent with   abnormal left ventricular relaxation (grade 1 diastolic   dysfunction). - Aortic valve: Trileaflet; normal thickness, mildly calcified   leaflets. - Mitral valve: There was mild regurgitation. - Left atrium: The atrium was mildly dilated. - Pulmonary arteries: Systolic pressure was moderately increased.   PA peak pressure: 46 mm Hg (S).   PHYSICAL EXAM Elderly African American lady who is intubated but not sedated. Afebrile.  Head is nontraumatic. Cardiac exam no murmur or gallop. Distal pulses are well felt. Neurological Exam :  Patient is intubated not on sedation. Eyes closed and resistant for forced opening, not following commands. Pupils are not able to be examined due to resistant to forced opening. Facial symmetry not able to tested. Tongue midline in mouth. All extremities in flexion position, significantly increased tone. On pain stimulation, mild withdraw on BUEs but no movement on BLEs. Sensation and coordination and gait are not tested.  ASSESSMENT/PLAN [name] is a 83 y.o. female with history of  colon cancer (in remission) and subarachnoid hemorrhage in 2015 was status post PEG due to dysphagia and malnutrition, partial complex seizures, colon cancer status post chemotherapy in remission for 8 years presenting with cardiac arrest and acute/early subacute infarction in the right cerebellar hemisphere. She did not receive IV t-PA due to recent MI.   Cardiac arrest s/p CPR with encephalopathy  CCM on board  Intubated on ventilation  Not following commands  MRI did not show anoxic changes so far  Neuro status more consistent with encephalopathy in the setting of cardiac arrest and decreased brain reserve and diffuse ischemic WM changes.  Stroke: acute/early subacute punctate infarction in the right cerebellar white matter likely small vessel disease following hypotension from cardiac arrest vs. cardioembolic event due to cardiac arrest s/p CPR - however, this punctate stroke in cerebellum has nothing to do with pt current neuro status.     Resultant  Intubated for cardiac arrest  CT head: left frontal white matter hypo-attenuation.  MRI head:  acute/early subacute punctate infarction in the right cerebellar hemisphere  2D Echo: EF 60-65%. No source of embolus  LDL - 31  HgbA1c - 5.5  SCDs for VTE prophylaxis Diet NPO time specified  No antithrombotic prior to admission, now on  ASA 325mg  daily. Continue ASA on discharge  Ongoing aggressive stroke risk factor management  Therapy recommendations:  pending  Disposition:   Pending  Hx of CVT   In 01/2014 - resulted SAH and complex partial seizure as well venous infarct at b/l thalami and BG - EEG negative  Hypercoagulable state due to colon cancer vs. Dehydration  Was on coumadin and later switched to ASA 81 in 02/2016  GIB and decubitus ulcer stage III in 10/2016 - ASA QOD due to bleeding  HTN  BP stable  BP goal normotensive  Other Stroke Risk Factors  Advanced age  History of colon cancer s/p chemo and PEG - in remission since 8-9 years ago  Other Active Problems  Aspiration pneumonia - on cefepime  Anemia of chronic disease - 7.6 / 24.4  Malnutrition - severe at baseline  Hospital day # 4  This patient is critically ill due to advance age, cardiac arrest, encephalopathy, punctate infarct  and at significant risk of neurological worsening, death form heart failure, cardiac arrest, recurrent stroke. This patient's care requires constant monitoring of vital signs, hemodynamics, respiratory and cardiac monitoring, review of multiple databases, neurological assessment, discussion with family, other specialists and medical decision making of high complexity. I spent 35 minutes of neurocritical care time in the care of this patient.  Rosalin Hawking, MD PhD Stroke Neurology 01/02/2017 11:42 AM   To contact Stroke Continuity provider, please refer to [email or web address] After hours, contact General Neurology.

## 2017-01-02 NOTE — Progress Notes (Signed)
PULMONARY / CRITICAL CARE MEDICINE   Name: Kristi Durham MRN: [id number] DOB: [birth year]    ADMISSION DATE:  12/29/2016 CONSULTATION DATE:  01/02/2017  REFERRING MD:  ED  CHIEF COMPLAINT:  Cardiac arrest  HISTORY OF PRESENT ILLNESS:   History is obtained after speaking to her 2 sons and reviewing her medical record as well as the discharge summary from records from 79/4179. 83 year old presented from home after cardiac arrest. She was at her oncologist's office and was noted to be hypoxic to 90%, was brought back home and soon after sons had fed her via PEG, she developed agonal respirations and then stop breathing. EMS found her pulseless and required CPR with unknown downtime and epinephrine drip via IO before ROSC.  Past medical history of ischemic strokes and subarachnoid hemorrhage in 2015 was status post PEG due to dysphagia and malnutrition, partial complex seizures, colon cancer status post chemotherapy in remission for 8 years. Admission for/2018 [place] for hematemesis, found to have  Mallory-Weiss tear, found to have a sacral decubitus stage III  PAST MEDICAL HISTORY :  She  has a past medical history of Colon cancer ([place]) and Stroke ([place]).  PAST SURGICAL HISTORY: She  has no past surgical history on file.  No Known Allergies  No current facility-administered medications on file prior to encounter.    No current outpatient prescriptions on file prior to encounter.    FAMILY HISTORY:  Her has no family status information on file.    SOCIAL HISTORY: She  reports that she has never smoked. She has quit using smokeless tobacco. She reports that she does not drink alcohol.  REVIEW OF SYSTEMS:   Unable to obtain since unresponsive  SUBJECTIVE:  No issues overnight Has not been on weaning trial today AM  VITAL SIGNS: BP 112/70   Pulse 97   Temp 97.2 F (36.2 C) (Axillary)   Resp 16   Ht 5\' 2"  (1.575 m)   Wt 108 lb (49 kg)   SpO2 100%   BMI 19.75 kg/m    HEMODYNAMICS: CVP:  [4 mmHg-6 mmHg] 4 mmHg  VENTILATOR SETTINGS: Vent Mode: PRVC FiO2 (%):  [40 %] 40 % Set Rate:  [16 bmp] 16 bmp Vt Set:  [350 mL] 350 mL PEEP:  [5 cmH20] 5 cmH20 Plateau Pressure:  [19 cmH20-22 cmH20] 21 cmH20  INTAKE / OUTPUT: I/O last 3 completed shifts: In: 2327.5 [I.V.:597.5; NG/GT:1680; IV Piggyback:50] Out: 2500 [Urine:2500]  PHYSICAL EXAMINATION: Blood pressure 112/70, pulse 97, temperature 97.2 F (36.2 C), temperature source Axillary, resp. rate 16, height 5\' 2"  (1.575 m), weight 108 lb (49 kg), SpO2 100 %. Gen:      No acute distress HEENT:  EOMI, sclera anicteric Neck:     No masses; no thyromegaly Lungs:    Clear to auscultation bilaterally; normal respiratory effort CV:         Regular rate and rhythm; no murmurs Abd:      + bowel sounds; soft, non-tender; no palpable masses, no distension Ext:    No edema; adequate peripheral perfusion Skin:      Warm and dry; no rash Neuro: alert and oriented x 3 Psych: normal mood and affect  LABS:  BMET  Recent Labs Lab 12/31/16 0540 01/01/17 0934 01/02/17 0337  NA 133* 136 137  K 3.6 3.2* 3.7  CL 99* 101 98*  CO2 27 30 32  BUN 12 13 17   CREATININE 0.35* 0.34* 0.31*  GLUCOSE 122* 159* 99  Electrolytes  Recent Labs Lab 12/30/16 0130 12/31/16 0540 12/31/16 1803 01/01/17 0934 01/02/17 0337  CALCIUM 7.7* 7.8*  --  8.1* 8.4*  MG 1.8 1.8 1.9  --   --   PHOS 2.2* 1.7* 2.1*  --   --     CBC  Recent Labs Lab 12/31/16 0511 01/01/17 0337 01/02/17 0337  WBC 8.5 7.6 8.1  HGB 8.1* 7.2* 7.6*  HCT 25.7* 23.0* 24.4*  PLT 419* 385 402*    Coag's No results for input(s): APTT, INR in the last 168 hours.  Sepsis Markers  Recent Labs Lab 12/29/16 1558 12/29/16 2013 12/29/16 2015 12/30/16 0130 12/31/16 0540 01/01/17 0337  LATICACIDVEN 4.20* 5.6*  --  1.2  --   --   PROCALCITON  --   --  0.58  --  0.98 0.65    ABG  Recent Labs Lab 12/29/16 1637 12/30/16 0115  PHART  7.635* 7.424  PCO2ART 27.6* 47.2  PO2ART 183.0* 114*    Liver Enzymes  Recent Labs Lab 12/29/16 1546  AST 47*  ALT 29  ALKPHOS 75  BILITOT 0.4  ALBUMIN 1.8*    Cardiac Enzymes  Recent Labs Lab 12/29/16 2015 12/30/16 0130 12/31/16 0540  TROPONINI 0.10* 0.05* <0.03    Glucose  Recent Labs Lab 01/01/17 1119 01/01/17 1552 01/01/17 1914 01/01/17 2357 01/02/17 0348 01/02/17 0731  GLUCAP 165* 107* 116* 129* 98 129*    Imaging  STUDIES:  Head CT 6/26 >> Advanced age-related atrophy, white matter attenuation in left frontal region. Chest x-ray 6/26>> ET tube in place, bilateral basal opacities concerning for pneumonia. MRI head 6/28 > Rt cerebellar infarct, old left frontoparietal infarct. Changes consistent with prior [facility] EEG 6/27 > Diffuse slowing  Echo 6/28- LVEF 23-55%, grade 1 diastolic dysfunction, moderate pulmonary HTN UE [place] 6/27- No DVT I have reviewed all images personally.  CULTURES: Bcx 6/26 >  Sp CX 6/26 > GPC, GNR, GPR  ANTIBIOTICS: Vanco 6/26 >> 6/29 Cefepime 6/26 >>  SIGNIFICANT EVENTS:  LINES/TUBES: ETT 6/26 >> Lt IJ 6/27 >> I/O 6/26 >> 6/26  DISCUSSION: 83 Y/O with respiratory arrest followed by cardiac arrest- etiology is unclear, could be related to aspiration or seizure, have to rule out acute coronary syndrome chest x-ray shows bibasal consolidation/effusions.   ASSESSMENT / PLAN:  PULMONARY A: Respiratory arrest/acute respiratory failure Aspiration pneumonia P:   PSV weans as tolerated Continue antibiotics  CARDIOVASCULAR A:  Shock, likely cardiogenic versus septic EKG shows LAHB & prolonged qT Elevated troponin > trending down Lt arm swelling. No EVT P:  Continue lasix. Increase to 20 mg bid  RENAL A:   Hyponatremia, chronic P:   Follow lytes.   GASTROINTESTINAL A:   Severe protein calorie malnutrition, albumin 1.8 P:   Tube feeds via PEG  HEMATOLOGIC A:   Anemia of chronic disease P:  Follow CBC    INFECTIOUS A:   Aspiration pneumonia, recent hospitalization 10/2016 P:   Continue cefepime Off vanco Follow cx and Pct  ENDOCRINE A:   No issues   P:   SSI coverage  NEUROLOGIC A:   At risk Anoxic encephalopathy SAH + prior CVAs Acute rt cerebellar stroke P:   Discussed with neuro. Add aspirin Continue supportive care.   FAMILY  - Updates:  Family updated daily. They still want her full code. If she does not improve enough to be able to liberated from the vent over the next few days we will get palliative care involved again. She is  not a candidate for trach in my opinion. - Inter-disciplinary family meet or Palliative Care meeting due by:  day 7  The patient is critically ill with multiple organ system failure and requires high complexity decision making for assessment and support, frequent evaluation and titration of therapies, advanced monitoring, review of radiographic studies and interpretation of complex data.   Critical Care Time devoted to patient care services, exclusive of separately billable procedures, described in this note is 35 minutes.   Marshell Garfinkel MD [place] [facility] Pager [phone] If no answer or after 3pm call: [phone] 01/02/2017, 8:17 AM

## 2017-01-03 ENCOUNTER — Inpatient Hospital Stay (HOSPITAL_COMMUNITY): Payer: Medicare Other

## 2017-01-03 LAB — CBC
HEMATOCRIT: 23.1 % — AB (ref 36.0–46.0)
Hemoglobin: 7.3 g/dL — ABNORMAL LOW (ref 12.0–15.0)
MCH: 23.5 pg — AB (ref 26.0–34.0)
MCHC: 31.6 g/dL (ref 30.0–36.0)
MCV: 74.3 fL — AB (ref 78.0–100.0)
PLATELETS: 375 10*3/uL (ref 150–400)
RBC: 3.11 MIL/uL — ABNORMAL LOW (ref 3.87–5.11)
RDW: 17.6 % — AB (ref 11.5–15.5)
WBC: 8.3 10*3/uL (ref 4.0–10.5)

## 2017-01-03 LAB — BASIC METABOLIC PANEL
ANION GAP: 6 (ref 5–15)
BUN: 24 mg/dL — AB (ref 6–20)
CALCIUM: 8.4 mg/dL — AB (ref 8.9–10.3)
CO2: 35 mmol/L — AB (ref 22–32)
CREATININE: 0.35 mg/dL — AB (ref 0.44–1.00)
Chloride: 97 mmol/L — ABNORMAL LOW (ref 101–111)
GFR calc Af Amer: >60 mL/min (ref >60)
Glucose, Bld: 124 mg/dL — ABNORMAL HIGH (ref 65–99)
Potassium: 3.2 mmol/L — ABNORMAL LOW (ref 3.5–5.1)
Sodium: 138 mmol/L (ref 135–145)

## 2017-01-03 LAB — GLUCOSE, CAPILLARY
GLUCOSE-CAPILLARY: 126 mg/dL — AB (ref 65–99)
GLUCOSE-CAPILLARY: 128 mg/dL — AB (ref 65–99)
GLUCOSE-CAPILLARY: 115 mg/dL — AB (ref 65–99)
Glucose-Capillary: 119 mg/dL — ABNORMAL HIGH (ref 65–99)
Glucose-Capillary: 119 mg/dL — ABNORMAL HIGH (ref 65–99)

## 2017-01-03 LAB — CULTURE, BLOOD (ROUTINE X 2)
CULTURE: NO GROWTH
Culture: NO GROWTH
Special Requests: ADEQUATE

## 2017-01-03 LAB — PHOSPHORUS: Phosphorus: 2.2 mg/dL — ABNORMAL LOW (ref 2.5–4.6)

## 2017-01-03 LAB — MAGNESIUM: Magnesium: 1.6 mg/dL — ABNORMAL LOW (ref 1.7–2.4)

## 2017-01-03 MED ORDER — MAGNESIUM SULFATE 2 GM/50ML IV SOLN
2.0000 g | Freq: Once | INTRAVENOUS | Status: AC
  Administered 2017-01-03: 2 g via INTRAVENOUS
  Filled 2017-01-03: qty 50

## 2017-01-03 MED ORDER — POTASSIUM CHLORIDE 20 MEQ/15ML (10%) PO SOLN
30.0000 meq | ORAL | Status: AC
  Administered 2017-01-03 (×2): 30 meq
  Filled 2017-01-03 (×2): qty 30

## 2017-01-03 MED ORDER — ASPIRIN 325 MG PO TABS
325.0000 mg | ORAL_TABLET | Freq: Every day | ORAL | Status: DC
  Administered 2017-01-03 – 2017-01-07 (×5): 325 mg
  Filled 2017-01-03 (×4): qty 1

## 2017-01-03 MED ORDER — SODIUM PHOSPHATES 45 MMOLE/15ML IV SOLN
10.0000 mmol | Freq: Once | INTRAVENOUS | Status: AC
  Administered 2017-01-03: 10 mmol via INTRAVENOUS
  Filled 2017-01-03: qty 3.33

## 2017-01-03 NOTE — Progress Notes (Signed)
Spoke to MD about BP.  Keep MAP >60

## 2017-01-03 NOTE — Progress Notes (Signed)
[facility] ADULT ICU REPLACEMENT PROTOCOL FOR AM LAB REPLACEMENT ONLY  The patient does apply for the [facility] Adult ICU Electrolyte Replacment Protocol based on the criteria listed below:   1. Is GFR >/= 40 ml/min? Yes.    Patient's GFR today is >60 2. Is urine output >/= 0.5 ml/kg/hr for the last 6 hours? Yes.   Patient's UOP is 0.6 ml/kg/hr 3. Is BUN < 60 mg/dL? Yes.    Patient's BUN today is 24 4. Abnormal electrolyte(s): K+3.2  Mg 1.6  Phos 2.2 5. Ordered repletion with: Protocol 6. If a panic level lab has been reported, has the CCM MD in charge been notified? No..   Physician:  Otto Herb Hilliard 01/03/2017 5:54 AM

## 2017-01-03 NOTE — Progress Notes (Addendum)
PULMONARY / CRITICAL CARE MEDICINE   Name: Kristi Durham MRN: [id number] DOB: 1914/10/23    ADMISSION DATE:  12/29/2016 CONSULTATION DATE:  01/03/2017  REFERRING MD:  ED  CHIEF COMPLAINT:  Cardiac arrest  HISTORY OF PRESENT ILLNESS:   History is obtained after speaking to her 2 sons and reviewing her medical record as well as the discharge summary from records from 55/7132. 83 year old presented from home after cardiac arrest. She was at her oncologist's office and was noted to be hypoxic to 90%, was brought back home and soon after sons had fed her via PEG, she developed agonal respirations and then stop breathing. EMS found her pulseless and required CPR with unknown downtime and epinephrine drip via IO before ROSC.  Past medical history of ischemic strokes and subarachnoid hemorrhage in 2015 was status post PEG due to dysphagia and malnutrition, partial complex seizures, colon cancer status post chemotherapy in remission for 8 years. Admission for/2018 [place] for hematemesis, found to have  Mallory-Weiss tear, found to have a sacral decubitus stage III  PAST MEDICAL HISTORY :  She  has a past medical history of Colon cancer ([place]) and Stroke ([place]).  PAST SURGICAL HISTORY: She  has no past surgical history on file.  No Known Allergies  No current facility-administered medications on file prior to encounter.    No current outpatient prescriptions on file prior to encounter.    FAMILY HISTORY:  Her has no family status information on file.    SOCIAL HISTORY: She  reports that she has never smoked. She has quit using smokeless tobacco. She reports that she does not drink alcohol.  REVIEW OF SYSTEMS:   Unable to obtain since unresponsive  SUBJECTIVE:  No issues overnight BP is soft after getting lasix Weaning on  PSV 15/5  VITAL SIGNS: BP (!) 92/56   Pulse 87   Temp 100 F (37.8 C) (Axillary)   Resp 18   Ht 5\' 2"  (1.575 m)   Wt 106 lb (48.1 kg)   SpO2 100%    BMI 19.39 kg/m   HEMODYNAMICS: CVP:  [5 mmHg-8 mmHg] 8 mmHg  VENTILATOR SETTINGS: Vent Mode: PSV;CPAP FiO2 (%):  [40 %] 40 % Set Rate:  [16 bmp] 16 bmp Vt Set:  [350 mL] 350 mL PEEP:  [5 cmH20] 5 cmH20 Pressure Support:  [15 cmH20] 15 cmH20 Plateau Pressure:  [21 cmH20-23 cmH20] 21 cmH20  INTAKE / OUTPUT: I/O last 3 completed shifts: In: 2030 [I.V.:270; NG/GT:1710; IV Piggyback:50] Out: 1955 [Urine:1955]  PHYSICAL EXAMINATION: Gen:      No acute distress HEENT:  EOMI, sclera anicteric, ETT in place Neck:     No masses; no thyromegaly Lungs:    B/L rhonchi; normal respiratory effort CV:         Regular rate and rhythm; no murmurs Abd:      + bowel sounds; soft, non-tender; no palpable masses, no distension Ext:    Contracted extremities; adequate peripheral perfusion Skin:      Warm and dry; no rash Neuro:  Unresponsive.   LABS:  BMET  Recent Labs Lab 01/01/17 0934 01/02/17 0337 01/03/17 0417  NA 136 137 138  K 3.2* 3.7 3.2*  CL 101 98* 97*  CO2 30 32 35*  BUN 13 17 24*  CREATININE 0.34* 0.31* 0.35*  GLUCOSE 159* 99 124*    Electrolytes  Recent Labs Lab 12/31/16 0540 12/31/16 1803 01/01/17 0934 01/02/17 0337 01/03/17 0417  CALCIUM 7.8*  --  8.1* 8.4* 8.4*  MG 1.8 1.9  --   --  1.6*  PHOS 1.7* 2.1*  --   --  2.2*    CBC  Recent Labs Lab 01/01/17 0337 01/02/17 0337 01/03/17 0417  WBC 7.6 8.1 8.3  HGB 7.2* 7.6* 7.3*  HCT 23.0* 24.4* 23.1*  PLT 385 402* 375    Coag's No results for input(s): APTT, INR in the last 168 hours.  Sepsis Markers  Recent Labs Lab 12/29/16 1558 12/29/16 2013 12/29/16 2015 12/30/16 0130 12/31/16 0540 01/01/17 0337  LATICACIDVEN 4.20* 5.6*  --  1.2  --   --   PROCALCITON  --   --  0.58  --  0.98 0.65    ABG  Recent Labs Lab 12/29/16 1637 12/30/16 0115  PHART 7.635* 7.424  PCO2ART 27.6* 47.2  PO2ART 183.0* 114*    Liver Enzymes  Recent Labs Lab 12/29/16 1546  AST 47*  ALT 29  ALKPHOS 75   BILITOT 0.4  ALBUMIN 1.8*    Cardiac Enzymes  Recent Labs Lab 12/29/16 2015 12/30/16 0130 12/31/16 0540  TROPONINI 0.10* 0.05* <0.03    Glucose  Recent Labs Lab 01/02/17 1317 01/02/17 1605 01/02/17 2026 01/02/17 2337 01/03/17 0450 01/03/17 0847  GLUCAP 123* 120* 130* 116* 126* 119*    Imaging  STUDIES:  Head CT 6/26 >> Advanced age-related atrophy, white matter attenuation in left frontal region. Chest x-ray 6/26>> ET tube in place, bilateral basal opacities concerning for pneumonia. MRI head 6/28 > Rt cerebellar infarct, old left frontoparietal infarct. Changes consistent with prior [facility] EEG 6/27 > Diffuse slowing  Echo 6/28- LVEF 97-35%, grade 1 diastolic dysfunction, moderate pulmonary HTN UE [place] 6/27- No DVT I have reviewed all images personally.  CULTURES: Bcx 6/26 >  Sp CX 6/26 > GPC, GNR, GPR  ANTIBIOTICS: Vanco 6/26 >> 6/29 Cefepime 6/26 >>  SIGNIFICANT EVENTS:  LINES/TUBES: ETT 6/26 >> Lt IJ 6/27 >> I/O 6/26 >> 6/26  DISCUSSION: 83 Y/O with respiratory arrest followed by cardiac arrest- etiology is unclear, could be related to aspiration or seizure. Chest x-ray shows bibasal consolidation/effusions.   ASSESSMENT / PLAN:  PULMONARY A: Respiratory arrest/acute respiratory failure Aspiration pneumonia P:   PSV weans Continue antibiotics  CARDIOVASCULAR A:  Shock, likely cardiogenic versus septic EKG shows LAHB & prolonged qT Elevated troponin > trending down Lt arm swelling. No EVT P:  Hold lasix today as BP is low.   RENAL A:   Hyponatremia, chronic Hypokalemoa P:   Replete lytes  GASTROINTESTINAL A:   Severe protein calorie malnutrition, albumin 1.8 P:   Tube feeds via PEG  HEMATOLOGIC A:   Anemia of chronic disease P:  Follow CBC  INFECTIOUS A:   Aspiration pneumonia, recent hospitalization 10/2016 P:   Continue cefepime Off vanco  ENDOCRINE A:   No issues   P:   SSI coverage  NEUROLOGIC A:   At risk  Anoxic encephalopathy SAH + prior CVAs Acute rt cerebellar stroke P:   Discussed with neuro. Add aspirin Continue supportive care.   FAMILY  - Updates:  Family updated daily. If she does not improve enough to be able to liberated from the vent over the next few days we will get palliative care involved again. She is not a candidate for trach in my opinion. Would recommend comfort care if there is no progress in a few days - Inter-disciplinary family meet or Palliative Care meeting due by:  day 7  The patient is critically ill with multiple organ system  failure and requires high complexity decision making for assessment and support, frequent evaluation and titration of therapies, advanced monitoring, review of radiographic studies and interpretation of complex data.   Critical Care Time devoted to patient care services, exclusive of separately billable procedures, described in this note is 35 minutes.   Marshell Garfinkel MD [place] [facility] Pager [phone] If no answer or after 3pm call: [phone] 01/03/2017, 11:53 AM

## 2017-01-03 NOTE — Progress Notes (Addendum)
STROKE TEAM PROGRESS NOTE   SUBJECTIVE (INTERVAL HISTORY) Daughter is at the beside.  The patient is still intubated, eye closed, not following commands or opening eyes. I had long discussion with daughter at bedside, and let her know that patient punctate cerebellar infarct has nothing to do with her cardiac arrest or her current neuro status. Agree with CCM for continued treatment for the next several days, if no improvement, I think is reasonable for comfort care or hospice. Daughter is in agreement.   OBJECTIVE Temp:  [97.2 F (36.2 C)-100 F (37.8 C)] 100 F (37.8 C) (07/01 0852) Pulse Rate:  [71-100] 79 (07/01 0700) Cardiac Rhythm: Normal sinus rhythm (07/01 0700) Resp:  [15-25] 17 (07/01 0838) BP: (82-136)/(42-88) 83/54 (07/01 0838) SpO2:  [92 %-100 %] 100 % (07/01 0700) FiO2 (%):  [40 %] 40 % (07/01 0838) Weight:  [48.1 kg (106 lb)-52.2 kg (115 lb)] 48.1 kg (106 lb) (07/01 0451)  CBC:  Recent Labs Lab 12/29/16 1546  01/02/17 0337 01/03/17 0417  WBC 11.0*  < > 8.1 8.3  NEUTROABS 10.2*  --   --   --   HGB 8.2*  < > 7.6* 7.3*  HCT 26.3*  < > 24.4* 23.1*  MCV 76.2*  < > 76.0* 74.3*  PLT 420*  < > 402* 375  < > = values in this interval not displayed.  Basic Metabolic Panel:   Recent Labs Lab 12/31/16 1803  01/02/17 0337 01/03/17 0417  NA  --   < > 137 138  K  --   < > 3.7 3.2*  CL  --   < > 98* 97*  CO2  --   < > 32 35*  GLUCOSE  --   < > 99 124*  BUN  --   < > 17 24*  CREATININE  --   < > 0.31* 0.35*  CALCIUM  --   < > 8.4* 8.4*  MG 1.9  --   --  1.6*  PHOS 2.1*  --   --  2.2*  < > = values in this interval not displayed.  Lipid Panel:     Component Value Date/Time   CHOL 62 01/01/2017 1330   TRIG 45 01/01/2017 1330   HDL 22 (L) 01/01/2017 1330   CHOLHDL 2.8 01/01/2017 1330   VLDL 9 01/01/2017 1330   LDLCALC 31 01/01/2017 1330   HgbA1c:  Lab Results  Component Value Date   HGBA1C 5.5 01/01/2017   Urine Drug Screen: No results found for: LABOPIA,  COCAINSCRNUR, LABBENZ, AMPHETMU, THCU, LABBARB  Alcohol Level     Component Value Date/Time   ETH <5 12/29/2016 1612    IMAGING I have personally reviewed the radiological images below and agree with the radiology interpretations.  CT Head 12/29/2016 1. Advanced atrophy and chronic small vessel white matter ischemic disease. White matter hypo attenuation in the high left frontal region is identified with apparent preservation of the overlying cortex, raising concern for vasogenic edema. As underlying mass lesion would be a consideration, MRI without and with contrast would be the study of choice to further evaluate.  Mr Kizzie Fantasia Contrast 12/31/2016 1. Punctate acute/early subacute infarction in the right cerebellar hemisphere.  2. Advanced chronic microvascular ischemic changes of white matter and brain parenchymal volume loss.  3. Chronic left frontoparietal junction hemorrhagic infarction. This corresponds to region of hypoattenuation on prior CT.  4. Superficial siderosis along the left paramedian frontal and parietal lobes compatible prior subarachnoid hemorrhage.  Dg Abdomen Peg Tube Location 12/30/2016 1. PEG and orogastric tubes noted in the stomach. No gastric distention. Oral contrast in the stomach, duodenum, and proximal small bowel.  2. Basilar atelectasis and bilateral pleural effusions.   Dg Chest Port 1 View 12/31/2016 1. Lines and tubes in stable position.  2. Cardiomegaly with bilateral pulmonary infiltrates/ edema again noted without significant change. Stable left-sided pleural effusion. Improved right pleural effusion 01/01/2017 Bibasilar effusions and atelectasis with perihilar infiltrates favoring pulmonary edema, greater on LEFT. 01/03/2017 Lines and tubes well positioned. Persistent edema pattern with bilateral effusions and lower lobe volume loss.  TTE - Left ventricle: The cavity size was normal. Systolic function was   normal. The estimated ejection fraction  was in the range of 60% to 65%.    Wall motion was normal; there were no regional wall   motion abnormalities. Doppler parameters are consistent with   abnormal left ventricular relaxation (grade 1 diastolic   dysfunction). - Aortic valve: Trileaflet; normal thickness, mildly calcified   leaflets. - Mitral valve: There was mild regurgitation. - Left atrium: The atrium was mildly dilated. - Pulmonary arteries: Systolic pressure was moderately increased.   PA peak pressure: 46 mm Hg (S).   PHYSICAL EXAM Elderly African American lady who is intubated but not sedated. Afebrile. Head is nontraumatic. Cardiac exam no murmur or gallop. Distal pulses are well felt. Neurological Exam :  Patient is intubated not on sedation. Eyes closed and resistant for forced opening, not following commands. Pupils are not able to be examined due to resistant to forced opening. Facial symmetry not able to tested. Tongue midline in mouth. All extremities in flexion position, significantly increased tone. On pain stimulation, mild withdraw on BUEs but no movement on BLEs. Sensation and coordination and gait are not tested.  ASSESSMENT/PLAN [name] is a 83 y.o. female with history of  colon cancer (in remission) and subarachnoid hemorrhage in 2015 was status post PEG due to dysphagia and malnutrition, partial complex seizures, colon cancer status post chemotherapy in remission for 8 years presenting with cardiac arrest and acute/early subacute infarction in the right cerebellar hemisphere. She did not receive IV t-PA due to recent MI.   Cardiac arrest s/p CPR with encephalopathy  CCM on board  Intubated on ventilation  Not following commands  MRI did not show anoxic changes so far  Neuro status more consistent with encephalopathy in the setting of cardiac arrest and decreased brain reserve and diffuse ischemic WM changes.  Stroke: acute/early subacute punctate infarction in the right cerebellar  white matter likely small vessel disease following hypotension from cardiac arrest vs. cardioembolic event due to cardiac arrest s/p CPR - however, this punctate stroke in cerebellum has nothing to do with pt current neuro status.     Resultant  Intubated for cardiac arrest  CT head: left frontal white matter hypo-attenuation.  MRI head:  acute/early subacute punctate infarction in the right cerebellar hemisphere  2D Echo: EF 60-65%. No source of embolus  LDL - 31  HgbA1c - 5.5  SCDs for VTE prophylaxis Diet NPO time specified, on tube feeding  No antithrombotic prior to admission, now on ASA 325mg  daily. Continue ASA on discharge  Ongoing aggressive stroke risk factor management  Therapy recommendations:  pending  Disposition:   Pending  Hx of CVT   In 01/2014 - resulted SAH and complex partial seizure as well venous infarct at b/l thalami and BG - EEG negative  Hypercoagulable state due to colon cancer  vs. Dehydration  Was on coumadin and later switched to ASA 81 in 02/2016  GIB and decubitus ulcer stage III in 10/2016 - ASA QOD due to bleeding  HTN  BP stable  BP goal normotensive  Other Stroke Risk Factors  Advanced age  History of colon cancer s/p chemo and PEG - in remission since 8-9 years ago  Other Active Problems  Aspiration pneumonia - on cefepime  Anemia of chronic disease - 7.6 -> 7.3  Malnutrition - severe at baseline  Electrolyte disturbance - hypokalemia - hypomagnesemia - hypophosphatemia  Hx of spasticity - follow up with Dr. Carles Collet in [facility] day # 5  Neurology will sign off. Please call with questions. No neuro follow-up needed at this time. Thanks for the consult.  Rosalin Hawking, MD PhD Stroke Neurology 01/03/2017 11:42 AM   To contact Stroke Continuity provider, please refer to [email or web address] After hours, contact General Neurology.

## 2017-01-04 ENCOUNTER — Inpatient Hospital Stay (HOSPITAL_COMMUNITY): Payer: Medicare Other

## 2017-01-04 DIAGNOSIS — I6330 Cerebral infarction due to thrombosis of unspecified cerebral artery: Secondary | ICD-10-CM

## 2017-01-04 LAB — CBC
HCT: 20.6 % — ABNORMAL LOW (ref 36.0–46.0)
Hemoglobin: 6.6 g/dL — CL (ref 12.0–15.0)
MCH: 23.7 pg — ABNORMAL LOW (ref 26.0–34.0)
MCHC: 32 g/dL (ref 30.0–36.0)
MCV: 73.8 fL — ABNORMAL LOW (ref 78.0–100.0)
Platelets: 346 10*3/uL (ref 150–400)
RBC: 2.79 MIL/uL — ABNORMAL LOW (ref 3.87–5.11)
RDW: 17.7 % — AB (ref 11.5–15.5)
WBC: 10.3 10*3/uL (ref 4.0–10.5)

## 2017-01-04 LAB — GLUCOSE, CAPILLARY
GLUCOSE-CAPILLARY: 118 mg/dL — AB (ref 65–99)
GLUCOSE-CAPILLARY: 134 mg/dL — AB (ref 65–99)
Glucose-Capillary: 118 mg/dL — ABNORMAL HIGH (ref 65–99)
Glucose-Capillary: 118 mg/dL — ABNORMAL HIGH (ref 65–99)
Glucose-Capillary: 133 mg/dL — ABNORMAL HIGH (ref 65–99)
Glucose-Capillary: 94 mg/dL (ref 65–99)
Glucose-Capillary: 127 mg/dL — ABNORMAL HIGH (ref 65–99)

## 2017-01-04 LAB — BASIC METABOLIC PANEL
Anion gap: 5 (ref 5–15)
BUN: 28 mg/dL — ABNORMAL HIGH (ref 6–20)
CALCIUM: 8.1 mg/dL — AB (ref 8.9–10.3)
CO2: 34 mmol/L — ABNORMAL HIGH (ref 22–32)
CREATININE: 0.4 mg/dL — AB (ref 0.44–1.00)
Chloride: 97 mmol/L — ABNORMAL LOW (ref 101–111)
Glucose, Bld: 121 mg/dL — ABNORMAL HIGH (ref 65–99)
Potassium: 3.6 mmol/L (ref 3.5–5.1)
SODIUM: 136 mmol/L (ref 135–145)

## 2017-01-04 LAB — ABO/RH: ABO/RH(D): A POS

## 2017-01-04 LAB — PREPARE RBC (CROSSMATCH)

## 2017-01-04 MED ORDER — SODIUM CHLORIDE 0.9 % IV SOLN
Freq: Once | INTRAVENOUS | Status: AC
  Administered 2017-01-04: 12:00:00 via INTRAVENOUS

## 2017-01-04 MED ORDER — POTASSIUM PHOSPHATES 15 MMOLE/5ML IV SOLN: 30.0000 mmol | Freq: Once | INTRAVENOUS | Status: DC

## 2017-01-04 MED ORDER — MAGNESIUM SULFATE 2 GM/50ML IV SOLN: 2.0000 g | Freq: Once | INTRAVENOUS | Status: DC

## 2017-01-04 MED ORDER — POTASSIUM CHLORIDE 20 MEQ/15ML (10%) PO SOLN
20.0000 meq | ORAL | Status: DC
  Administered 2017-01-04: 20 meq
  Filled 2017-01-04: qty 15

## 2017-01-04 NOTE — Progress Notes (Signed)
PULMONARY / CRITICAL CARE MEDICINE   Name: Kristi Durham MRN: [id number] DOB: May 16, 1915    ADMISSION DATE:  12/29/2016 CONSULTATION DATE:  01/04/2017  REFERRING MD:  ED  CHIEF COMPLAINT:  Cardiac arrest  HISTORY OF PRESENT ILLNESS:   History is obtained after speaking to her 2 sons and reviewing her medical record as well as the discharge summary from records from 16/2378. 83 year old presented from home after cardiac arrest. She was at her oncologist's office and was noted to be hypoxic to 90%, was brought back home and soon after sons had fed her via PEG, she developed agonal respirations and then stop breathing. EMS found her pulseless and required CPR with unknown downtime and epinephrine drip via IO before ROSC.  Past medical history of ischemic strokes and subarachnoid hemorrhage in 2015 was status post PEG due to dysphagia and malnutrition, partial complex seizures, colon cancer status post chemotherapy in remission for 8 years. Admission for/2018 [place] for hematemesis, found to have  Mallory-Weiss tear, found to have a sacral decubitus stage III  SUBJECTIVE:  No events overnight, no complaints, weaning on high PS  VITAL SIGNS: BP (!) 88/65 (BP Location: Right Arm)   Pulse 77   Temp 100 F (37.8 C)   Resp (!) 23   Ht 5\' 2"  (1.575 m)   Wt 104 lb (47.2 kg)   SpO2 100%   BMI 19.02 kg/m   HEMODYNAMICS: CVP:  [4 mmHg-7 mmHg] 4 mmHg  VENTILATOR SETTINGS: Vent Mode: SIMV/PC/PS;CPAP FiO2 (%):  [40 %] 40 % Set Rate:  [15 bmp-16 bmp] 15 bmp Vt Set:  [350 mL] 350 mL PEEP:  [5 cmH20] 5 cmH20 Pressure Support:  [12 cmH20-15 cmH20] 12 cmH20 Plateau Pressure:  [18 cmH20-26 cmH20] 18 cmH20  INTAKE / OUTPUT: I/O last 3 completed shifts: In: 1988.3 [I.V.:30; NG/GT:1605; IV Piggyback:353.3] Out: 1027 [Urine:1470]  PHYSICAL EXAMINATION: Gen:      No acute distress HEENT: EOMI, sclera anicteric, ETT in place Neck:     No masses; no thyromegaly Lungs:   B/L rhonchi;  normal respiratory effort CV:        Regular rate and rhythm; no murmurs Abd:     + bowel sounds; soft, non-tender; no palpable masses, no distension Ext:   Contracted extremities; adequate peripheral perfusion Skin:      Warm and dry; no rash Neuro:   Unresponsive.   LABS:  BMET  Recent Labs Lab 01/02/17 0337 01/03/17 0417 01/04/17 0406  NA 137 138 136  K 3.7 3.2* 3.6  CL 98* 97* 97*  CO2 32 35* 34*  BUN 17 24* 28*  CREATININE 0.31* 0.35* 0.40*  GLUCOSE 99 124* 121*   Electrolytes  Recent Labs Lab 12/31/16 0540 12/31/16 1803  01/02/17 0337 01/03/17 0417 01/04/17 0406  CALCIUM 7.8*  --   < > 8.4* 8.4* 8.1*  MG 1.8 1.9  --   --  1.6*  --   PHOS 1.7* 2.1*  --   --  2.2*  --   < > = values in this interval not displayed.  CBC  Recent Labs Lab 01/02/17 0337 01/03/17 0417 01/04/17 0406  WBC 8.1 8.3 10.3  HGB 7.6* 7.3* 6.6*  HCT 24.4* 23.1* 20.6*  PLT 402* 375 346    Coag's No results for input(s): APTT, INR in the last 168 hours.  Sepsis Markers  Recent Labs Lab 12/29/16 1558 12/29/16 2013 12/29/16 2015 12/30/16 0130 12/31/16 0540 01/01/17 0337  LATICACIDVEN 4.20* 5.6*  --  1.2  --   --   PROCALCITON  --   --  0.58  --  0.98 0.65    ABG  Recent Labs Lab 12/29/16 1637 12/30/16 0115  PHART 7.635* 7.424  PCO2ART 27.6* 47.2  PO2ART 183.0* 114*    Liver Enzymes  Recent Labs Lab 12/29/16 1546  AST 47*  ALT 29  ALKPHOS 75  BILITOT 0.4  ALBUMIN 1.8*    Cardiac Enzymes  Recent Labs Lab 12/29/16 2015 12/30/16 0130 12/31/16 0540  TROPONINI 0.10* 0.05* <0.03    Glucose  Recent Labs Lab 01/03/17 1141 01/03/17 1616 01/03/17 2030 01/03/17 2354 01/04/17 0331 01/04/17 0738  GLUCAP 128* 115* 119* 118* 118* 118*    Imaging  STUDIES:  Head CT 6/26 >> Advanced age-related atrophy, white matter attenuation in left frontal region. Chest x-ray 6/26>> ET tube in place, bilateral basal opacities concerning for pneumonia. MRI head  6/28 > Rt cerebellar infarct, old left frontoparietal infarct. Changes consistent with prior [facility] EEG 6/27 > Diffuse slowing  Echo 6/28- LVEF 41-93%, grade 1 diastolic dysfunction, moderate pulmonary HTN UE [place] 6/27- No DVT I have reviewed all images personally.  CULTURES: Bcx 6/26 >  Sp CX 6/26 > GPC, GNR, GPR  ANTIBIOTICS: Vanco 6/26 >> 6/29 Cefepime 6/26 >>  SIGNIFICANT EVENTS:  LINES/TUBES: ETT 6/26 >> Lt IJ 6/27 >> I/O 6/26 >> 6/26  DISCUSSION: 83 Y/O with respiratory arrest followed by cardiac arrest- etiology is unclear, could be related to aspiration or seizure. Chest x-ray shows bibasal consolidation/effusions.   ASSESSMENT / PLAN:  PULMONARY A: Respiratory arrest/acute respiratory failure Aspiration pneumonia P:   PS as tolerated, no extubation given mental status Family discussing trach vs terminal extubation Continue antibiotics  CARDIOVASCULAR A:  Shock, likely cardiogenic versus septic EKG shows LAHB & prolonged qT Elevated troponin > trending down Lt arm swelling. No EVT P:  Hold lasix today as BP is low.  Pressors as needed  RENAL A:   Hyponatremia, chronic Hypokalemoa P:   Replete lytes as indicated BMET in AM [facility] IVF  GASTROINTESTINAL A:   Severe protein calorie malnutrition, albumin 1.8 P:   Tube feeds via PEG  HEMATOLOGIC A:   Anemia of chronic disease P:  Follow CBC  INFECTIOUS A:   Aspiration pneumonia, recent hospitalization 10/2016 P:   Continue cefepime for total of 8 days Off vanco  ENDOCRINE A:   No issues   P:   CBG SSI  NEUROLOGIC A:   At risk Anoxic encephalopathy SAH + prior CVAs Acute rt cerebellar stroke P:   Add aspirin Continue supportive care.  D/C PRN sedation  FAMILY  - Updates: Spoke with daughter bedside.  After discussion, full code for now, will stop all sedation and if no response towards the end of the week then will decide on trach vs extubation.  - Inter-disciplinary family meet or  Palliative Care meeting due by:  day 7  The patient is critically ill with multiple organ systems failure and requires high complexity decision making for assessment and support, frequent evaluation and titration of therapies, application of advanced monitoring technologies and extensive interpretation of multiple databases.   Critical Care Time devoted to patient care services described in this note is  35  Minutes. This time reflects time of care of this signee Dr Jennet Maduro. This critical care time does not reflect procedure time, or teaching time or supervisory time of PA/NP/Med student/Med Resident etc but could involve care discussion time.  Rush Farmer,  M.D. [facility] Pulmonary/Critical Care Medicine. Pager: [phone]. After hours pager: [phone].  01/04/2017, 9:25 AM

## 2017-01-04 NOTE — Progress Notes (Signed)
eLink Physician-Brief Progress Note Patient Name: Kristi Durham DOB: 01-Feb-1915 MRN: [id number]   Date of Service  01/04/2017  HPI/Events of Note  Hgb 6.6, has slowly trended down  eICU Interventions  Considering advanced age and comorbid illnesses will defer using blood transfusion to day team     Intervention Category Intermediate Interventions: Diagnostic test evaluation  Simonne Maffucci 01/04/2017, 4:55 AM

## 2017-01-04 NOTE — Progress Notes (Signed)
Palliative Medicine RN Note:  Stopped by room to offer support. No family present. Pt remains on vent. Per Dr [name] note, family is considering potential terminal wean later this week. PMT will continue to make daily visits to support family if they are present. We will also follow in case family needs continued assistance from our providers. Please call our office with any questions/concerns.  Marjie Skiff Avangelina Flight, RN, BSN, [facility] 01/04/2017 11:07 AM Cell [phone] 8:00-4:00 Monday-Friday Office [phone]

## 2017-01-04 NOTE — Progress Notes (Signed)
Pharmacy Antibiotic Note  Kristi Durham is a 83 y.o. female admitted on 12/29/2016 with aspiration PNA, recent hospitalization.  Pharmacy has been consulted for cefepime dosing.  Vancomycin stopped.  Renal function fairly stable, cultures negative thus far.  Plan: 1. Continue cefepime 500 mg IV q 24 hrs. 2. F/u renal function, cultures and length of threapy.  Height: 5\' 2"  (157.5 cm) Weight: 104 lb (47.2 kg) IBW/kg (Calculated) : 50.1  Temp (24hrs), Avg:99 F (37.2 C), Min:97.9 F (36.6 C), Max:100 F (37.8 C)   Recent Labs Lab 12/29/16 1558 12/29/16 2013 12/30/16 0130 12/31/16 0511 12/31/16 0540 01/01/17 0337 01/01/17 0934 01/02/17 0337 01/03/17 0417 01/04/17 0406  WBC  --   --   --  8.5  --  7.6  --  8.1 8.3 10.3  CREATININE  --   --  0.42*  --  0.35*  --  0.34* 0.31* 0.35* 0.40*  LATICACIDVEN 4.20* 5.6* 1.2  --   --   --   --   --   --   --     Estimated Creatinine Clearance: 28.6 mL/min (A) (by C-G formula based on SCr of 0.4 mg/dL (L)).    No Known Allergies   Antimicrobials this admission: 6/26 Vanc >> 6/29 6/26 cefepime >>   Microbiology results:  6/26 Trach asp> nf 6/26 MRSA PCR > neg 6/26  BCx x 2 > neg 6/26 UCx 80K yeast  Thank you for allowing pharmacy to be a part of this patient's care.  Uvaldo Rising, BCPS  Clinical Pharmacist Pager [phone]  01/04/2017 8:56 AM

## 2017-01-04 NOTE — Progress Notes (Signed)
[facility] ADULT ICU REPLACEMENT PROTOCOL FOR AM LAB REPLACEMENT ONLY  The patient does apply for the [facility] Adult ICU Electrolyte Replacment Protocol based on the criteria listed below:   1. Is GFR >/= 40 ml/min? Yes.    Patient's GFR today is >60 2. Is urine output >/= 0.5 ml/kg/hr for the last 6 hours? Yes.   Patient's UOP is 0.6 ml/kg/hr 3. Is BUN < 60 mg/dL? Yes.    Patient's BUN today is 28 4. Abnormal electrolyte(s): K+3.6 5. Ordered repletion with: Protocol 6. If a panic level lab has been reported, has the CCM MD in charge been notified? No..   Physician:  Jordan Likes 01/04/2017 5:39 AM

## 2017-01-05 ENCOUNTER — Inpatient Hospital Stay (HOSPITAL_COMMUNITY): Payer: Medicare Other

## 2017-01-05 DIAGNOSIS — G9340 Encephalopathy, unspecified: Secondary | ICD-10-CM

## 2017-01-05 LAB — BASIC METABOLIC PANEL
Anion gap: 6 (ref 5–15)
BUN: 29 mg/dL — ABNORMAL HIGH (ref 6–20)
CALCIUM: 8.1 mg/dL — AB (ref 8.9–10.3)
CHLORIDE: 98 mmol/L — AB (ref 101–111)
CO2: 33 mmol/L — ABNORMAL HIGH (ref 22–32)
CREATININE: 0.31 mg/dL — AB (ref 0.44–1.00)
GFR calc non Af Amer: >60 mL/min (ref >60)
Glucose, Bld: 120 mg/dL — ABNORMAL HIGH (ref 65–99)
Potassium: 3.4 mmol/L — ABNORMAL LOW (ref 3.5–5.1)
SODIUM: 137 mmol/L (ref 135–145)

## 2017-01-05 LAB — TYPE AND SCREEN
ABO/RH(D): A POS
Antibody Screen: NEGATIVE
Unit division: 0

## 2017-01-05 LAB — BPAM RBC
BLOOD PRODUCT EXPIRATION DATE: 201807062359
ISSUE DATE / TIME: 201807021128
UNIT TYPE AND RH: 9500

## 2017-01-05 LAB — CBC
HCT: 26.4 % — ABNORMAL LOW (ref 36.0–46.0)
HEMOGLOBIN: 8.4 g/dL — AB (ref 12.0–15.0)
MCH: 24.2 pg — ABNORMAL LOW (ref 26.0–34.0)
MCHC: 31.8 g/dL (ref 30.0–36.0)
MCV: 76.1 fL — ABNORMAL LOW (ref 78.0–100.0)
Platelets: 331 10*3/uL (ref 150–400)
RBC: 3.47 MIL/uL — ABNORMAL LOW (ref 3.87–5.11)
RDW: 17.6 % — ABNORMAL HIGH (ref 11.5–15.5)
WBC: 12.3 10*3/uL — ABNORMAL HIGH (ref 4.0–10.5)

## 2017-01-05 LAB — BLOOD GAS, ARTERIAL
Acid-Base Excess: 10.6 mmol/L — ABNORMAL HIGH (ref 0.0–2.0)
Bicarbonate: 32.9 mmol/L — ABNORMAL HIGH (ref 20.0–28.0)
DRAWN BY: 36277
FIO2: 40
MECHVT: 350 mL
O2 Saturation: 97.3 %
PEEP: 5 cmH2O
Patient temperature: 99.3
RATE: 15 resp/min
pCO2 arterial: 30 mmHg — ABNORMAL LOW (ref 32.0–48.0)
pH, Arterial: 7.643 (ref 7.350–7.450)
pO2, Arterial: 181 mmHg — ABNORMAL HIGH (ref 83.0–108.0)

## 2017-01-05 LAB — GLUCOSE, CAPILLARY
GLUCOSE-CAPILLARY: 120 mg/dL — AB (ref 65–99)
GLUCOSE-CAPILLARY: 132 mg/dL — AB (ref 65–99)
GLUCOSE-CAPILLARY: 115 mg/dL — AB (ref 65–99)
Glucose-Capillary: 136 mg/dL — ABNORMAL HIGH (ref 65–99)
Glucose-Capillary: 103 mg/dL — ABNORMAL HIGH (ref 65–99)

## 2017-01-05 LAB — PHOSPHORUS: PHOSPHORUS: 2.7 mg/dL (ref 2.5–4.6)

## 2017-01-05 LAB — MAGNESIUM: MAGNESIUM: 1.9 mg/dL (ref 1.7–2.4)

## 2017-01-05 MED ORDER — POTASSIUM CHLORIDE 20 MEQ/15ML (10%) PO SOLN: 40.0000 meq | Freq: Once | ORAL | Status: DC

## 2017-01-05 MED ORDER — POTASSIUM PHOSPHATES 15 MMOLE/5ML IV SOLN
30.0000 mmol | Freq: Once | INTRAVENOUS | Status: AC
  Administered 2017-01-05: 30 mmol via INTRAVENOUS
  Filled 2017-01-05: qty 10

## 2017-01-05 MED ORDER — MAGNESIUM SULFATE 2 GM/50ML IV SOLN
2.0000 g | Freq: Once | INTRAVENOUS | Status: AC
  Administered 2017-01-05: 2 g via INTRAVENOUS
  Filled 2017-01-05: qty 50

## 2017-01-05 NOTE — Progress Notes (Signed)
Pt transported to 4N w/ no apparent complications.  Pt remains on same vent settings.  Unit RT aware.

## 2017-01-05 NOTE — Progress Notes (Signed)
RT NOTE:  Pt vent switched to PS/CPAP 15/5 per MD verbal request following ABG. Pt tolerating well @ this time. RT will monitor.

## 2017-01-05 NOTE — Progress Notes (Signed)
Pt moved to 4N30 with 2 RNs and RT. Tolerated move well. No belongings at bedside. Receiving RN at bedside.

## 2017-01-05 NOTE — Progress Notes (Signed)
[place] Progress Note Patient Name: Kristi Durham DOB: Mar 20, 1915 MRN: [id number]   Date of Service  01/05/2017  HPI/Events of Note  resp alkalosis  eICU Interventions  Change vent setting to pressure support     Intervention Category Major Interventions: Respiratory failure - evaluation and management  Simonne Maffucci 01/05/2017, 5:02 AM

## 2017-01-05 NOTE — Progress Notes (Signed)
PULMONARY / CRITICAL CARE MEDICINE   Name: Kristi Durham MRN: [id number] DOB: 09-Mar-1915    ADMISSION DATE:  12/29/2016 CONSULTATION DATE:  01/05/2017  REFERRING MD:  ED  CHIEF COMPLAINT:  Cardiac arrest  HISTORY OF PRESENT ILLNESS:   History is obtained after speaking to her 2 sons and reviewing her medical record as well as the discharge summary from records from 13/3321. 83 year old presented from home after cardiac arrest. She was at her oncologist's office and was noted to be hypoxic to 90%, was brought back home and soon after sons had fed her via PEG, she developed agonal respirations and then stop breathing. EMS found her pulseless and required CPR with unknown downtime and epinephrine drip via IO before ROSC.  Past medical history of ischemic strokes and subarachnoid hemorrhage in 2015 was status post PEG due to dysphagia and malnutrition, partial complex seizures, colon cancer status post chemotherapy in remission for 8 years. Admission for/2018 [place] for hematemesis, found to have  Mallory-Weiss tear, found to have a sacral decubitus stage III  SUBJECTIVE:  No events overnight.  Currently weaning on PS 5/5 and doing OK but having low volumes and mental status not great.  VITAL SIGNS: BP 111/65 (BP Location: Right Arm)   Pulse 84   Temp 98.6 F (37 C) (Axillary)   Resp (!) 26   Ht 5\' 2"  (1.575 m)   Wt 47.6 kg (105 lb)   SpO2 100%   BMI 19.20 kg/m   HEMODYNAMICS: CVP:  [4 mmHg-6 mmHg] 6 mmHg  VENTILATOR SETTINGS: Vent Mode: CPAP;PSV FiO2 (%):  [40 %] 40 % Set Rate:  [15 bmp] 15 bmp Vt Set:  [350 mL] 350 mL PEEP:  [5 cmH20] 5 cmH20 Pressure Support:  [12 cmH20-15 cmH20] 15 cmH20 Plateau Pressure:  [18 cmH20-23 cmH20] 19 cmH20  INTAKE / OUTPUT: I/O last 3 completed shifts: In: 2625 [I.V.:260; Blood:335; NG/GT:1980; IV Piggyback:50] Out: 93 [Urine:1110]  PHYSICAL EXAMINATION: Gen:      No acute distress HEENT: EOMI, sclera anicteric, ETT in  place Neck:     No masses; no thyromegaly Lungs:   B/L rhonchi; normal respiratory effort CV:        Regular rate and rhythm; no murmurs Abd:     + bowel sounds; soft, non-tender; no palpable masses, no distension Ext:   Contracted extremities; adequate peripheral perfusion Skin:      Warm and dry; no rash Neuro:   Opens eyes but unable to follow commands.  LABS:  BMET  Recent Labs Lab 01/03/17 0417 01/04/17 0406 01/05/17 0331  NA 138 136 137  K 3.2* 3.6 3.4*  CL 97* 97* 98*  CO2 35* 34* 33*  BUN 24* 28* 29*  CREATININE 0.35* 0.40* 0.31*  GLUCOSE 124* 121* 120*   Electrolytes  Recent Labs Lab 12/31/16 1803  01/03/17 0417 01/04/17 0406 01/05/17 0331  CALCIUM  --   < > 8.4* 8.1* 8.1*  MG 1.9  --  1.6*  --  1.9  PHOS 2.1*  --  2.2*  --  2.7  < > = values in this interval not displayed.  CBC  Recent Labs Lab 01/03/17 0417 01/04/17 0406 01/05/17 0331  WBC 8.3 10.3 12.3*  HGB 7.3* 6.6* 8.4*  HCT 23.1* 20.6* 26.4*  PLT 375 346 331    Coag's No results for input(s): APTT, INR in the last 168 hours.  Sepsis Markers  Recent Labs Lab 12/29/16 1558 12/29/16 2013 12/29/16 2015 12/30/16 0130 12/31/16 0540 01/01/17  9417  LATICACIDVEN 4.20* 5.6*  --  1.2  --   --   PROCALCITON  --   --  0.58  --  0.98 0.65    ABG  Recent Labs Lab 12/29/16 1637 12/30/16 0115 01/05/17 0328  PHART 7.635* 7.424 7.643*  PCO2ART 27.6* 47.2 30.0*  PO2ART 183.0* 114* 181*    Liver Enzymes  Recent Labs Lab 12/29/16 1546  AST 47*  ALT 29  ALKPHOS 75  BILITOT 0.4  ALBUMIN 1.8*    Cardiac Enzymes  Recent Labs Lab 12/29/16 2015 12/30/16 0130 12/31/16 0540  TROPONINI 0.10* 0.05* <0.03    Glucose  Recent Labs Lab 01/04/17 0738 01/04/17 1151 01/04/17 1514 01/04/17 1934 01/04/17 2346 01/05/17 0736  GLUCAP 118* 133* 94 127* 134* 120*    Imaging  STUDIES:  Head CT 6/26 >> Advanced age-related atrophy, white matter attenuation in left frontal  region. Chest x-ray 6/26>> ET tube in place, bilateral basal opacities concerning for pneumonia. MRI head 6/28 > Rt cerebellar infarct, old left frontoparietal infarct. Changes consistent with prior [facility] EEG 6/27 > Diffuse slowing  Echo 6/28- LVEF 40-81%, grade 1 diastolic dysfunction, moderate pulmonary HTN UE [place] 6/27- No DVT I have reviewed all images personally.  CULTURES: Bcx 6/26 >  Sp CX 6/26 > GPC, GNR, GPR  ANTIBIOTICS: Vanco 6/26 >> 6/29 Cefepime 6/26 >>  SIGNIFICANT EVENTS:  LINES/TUBES: ETT 6/26 >> Lt IJ 6/27 >> I/O 6/26 >> 6/26  DISCUSSION: 83 Y/O with respiratory arrest followed by cardiac arrest- etiology is unclear, could be related to aspiration or seizure. Chest x-ray shows bibasal consolidation/effusions.   ASSESSMENT / PLAN:  PULMONARY A: Respiratory arrest/acute respiratory failure Aspiration pneumonia P:   Continue PS as tolerated but no extubation given mental status and poor volumes Family discussing trach vs terminal extubation (family to arrive at hospital tomorrow 7/4 to make decision) Continue antibiotics  CARDIOVASCULAR A:  Shock, likely cardiogenic versus septic EKG shows LAHB & prolonged qT Elevated troponin > trending down Lt arm swelling. No DVT P:  Holding lasix given labile BPs Pressors as needed  RENAL A:   Hyponatremia, chronic - resolved Hypokalemia P:   Replete lytes as indicated - 36mEq K now BMET in AM [facility] IVF  GASTROINTESTINAL A:   Severe protein calorie malnutrition, albumin 1.8 P:   Tube feeds via PEG  HEMATOLOGIC A:   Anemia of chronic disease P:  Follow CBC Transfuse for Hgb < 7  INFECTIOUS A:   Aspiration pneumonia, recent hospitalization 10/2016 P:   Continue cefepime for total of 8 days (stop date 7/4) Off vanco  ENDOCRINE A:   No issues   P:   CBG SSI  NEUROLOGIC A:   At risk Anoxic encephalopathy SAH + prior CVAs Acute rt cerebellar stroke P:   Add aspirin Continue supportive  care.  D/C PRN sedation  FAMILY  - Updates: Spoke with daughter at bedside.  After discussion, full code for now, will stop all sedation and if no response towards the end of the week then will decide on trach vs extubation. 7/3: spoke with daughter again.  Family including son to arrive at bedside tomorrow 7/4 and will make decision on how to proceed (trach vs terminal extubation).  - Inter-disciplinary family meet or Palliative Care meeting due by:  day 7  CC time: 35 min.   Montey Hora, [place] Pulmonary & Critical [facility] Medicine Pager: [phone]  or [phone] 01/05/2017, 9:31 AM  Attending Note:  83 year old female s/p cardiac arrest who presents to the hospital intubated.  On exam, patient is not responsive with clear lungs.  I reviewed CXR myself, ETT in good position.  Discussed with PCCM-NP.  Spoke with daughter, they are still discussing trach placement vs withdrawal.  Will continue PS trials.  No extubation until discussion is complete.  Adjust vent for mixed alkalosis.  Will have further discussion in AM with family upon arrival.  Daughter would like to discuss with the rest of the family.  The patient is critically ill with multiple organ systems failure and requires high complexity decision making for assessment and support, frequent evaluation and titration of therapies, application of advanced monitoring technologies and extensive interpretation of multiple databases.   Critical Care Time devoted to patient care services described in this note is  35  Minutes. This time reflects time of care of this signee Dr Jennet Maduro. This critical care time does not reflect procedure time, or teaching time or supervisory time of PA/NP/Med student/Med Resident etc but could involve care discussion time.  Rush Farmer, M.D. [facility] Pulmonary/Critical Care Medicine. Pager: [phone]. After hours pager: [phone].

## 2017-01-05 NOTE — Progress Notes (Signed)
Palliative Medicine RN Note: stopped by room to provide support. No family present. Will follow up again tomorrow.  Marjie Skiff Keelee Yankey, RN, BSN, [facility] 01/05/2017 12:21 PM Cell [phone] 8:00-4:00 Monday-Friday Office [phone]

## 2017-01-05 NOTE — Progress Notes (Signed)
Patients left upper extremity red, swollen. MD notified and orders received for ultrasound to check for blood clot.

## 2017-01-05 NOTE — Progress Notes (Signed)
Nutrition Follow-up  INTERVENTION:   Vital High Protein @ 45 ml/hr  MVI daily  Provides: 1080 kcal, 94 grams protein, and 902 ml free water.   NUTRITION DIAGNOSIS:   Inadequate oral intake related to inability to eat as evidenced by NPO status. Ongoing.   GOAL:   Patient will meet greater than or equal to 90% of their needs Met.   MONITOR:   TF tolerance, Skin  ASSESSMENT:   Pt with PMH of colon cancer, stroke, PEG for dysphagia, and stage III ulcer who was admitted from home with after cardiac arrest.   Per MD pt is full code for now, family meeting 7/4 Patient is currently intubated on ventilator support MV: 6.5 L/min Temp (24hrs), Avg:99.1 F (37.3 C), Min:98.6 F (37 C), Max:99.3 F (37.4 C)  Medications reviewed and include: novolog, MVI, Kphos Labs reviewed: K+ 3.4 (L), phos and magnesium are WNL CBG's: 136-120 Pt is positive 6 L since admission but weight is down 25 lb if accurate   Diet Order:  Diet NPO time specified  Skin:   (stage III sacrum)  Last BM:  7/1  Height:   Ht Readings from Last 1 Encounters:  12/29/16 5' 2"  (1.575 m)    Weight:   Wt Readings from Last 1 Encounters:  01/05/17 105 lb (47.6 kg)    Ideal Body Weight:  50 kg  BMI:  Body mass index is 19.2 kg/m.  Estimated Nutritional Needs:   Kcal:  1100  Protein:  89-110 grams  Fluid:  > 1.5 L/day  EDUCATION NEEDS:   No education needs identified at this time  [place], [place], [place] Pager [phone] After Hours Pager

## 2017-01-06 ENCOUNTER — Encounter (HOSPITAL_COMMUNITY): Payer: Medicare Other

## 2017-01-06 ENCOUNTER — Inpatient Hospital Stay (HOSPITAL_COMMUNITY): Payer: Medicare Other

## 2017-01-06 LAB — CBC
HCT: 27.9 % — ABNORMAL LOW (ref 36.0–46.0)
Hemoglobin: 8.8 g/dL — ABNORMAL LOW (ref 12.0–15.0)
MCH: 24.3 pg — AB (ref 26.0–34.0)
MCHC: 31.5 g/dL (ref 30.0–36.0)
MCV: 77.1 fL — AB (ref 78.0–100.0)
PLATELETS: 348 10*3/uL (ref 150–400)
RBC: 3.62 MIL/uL — ABNORMAL LOW (ref 3.87–5.11)
RDW: 19 % — ABNORMAL HIGH (ref 11.5–15.5)
WBC: 15.2 10*3/uL — ABNORMAL HIGH (ref 4.0–10.5)

## 2017-01-06 LAB — BASIC METABOLIC PANEL WITH GFR
Anion gap: 6 (ref 5–15)
BUN: 25 mg/dL — ABNORMAL HIGH (ref 6–20)
CO2: 34 mmol/L — ABNORMAL HIGH (ref 22–32)
Calcium: 8 mg/dL — ABNORMAL LOW (ref 8.9–10.3)
Chloride: 97 mmol/L — ABNORMAL LOW (ref 101–111)
Creatinine, Ser: 0.3 mg/dL — ABNORMAL LOW (ref 0.44–1.00)
GFR calc Af Amer: >60 mL/min
GFR calc non Af Amer: >60 mL/min
Glucose, Bld: 129 mg/dL — ABNORMAL HIGH (ref 65–99)
Potassium: 3.7 mmol/L (ref 3.5–5.1)
Sodium: 137 mmol/L (ref 135–145)

## 2017-01-06 LAB — PHOSPHORUS: Phosphorus: 2.6 mg/dL (ref 2.5–4.6)

## 2017-01-06 LAB — GLUCOSE, CAPILLARY
GLUCOSE-CAPILLARY: 112 mg/dL — AB (ref 65–99)
GLUCOSE-CAPILLARY: 146 mg/dL — AB (ref 65–99)
Glucose-Capillary: 103 mg/dL — ABNORMAL HIGH (ref 65–99)
Glucose-Capillary: 118 mg/dL — ABNORMAL HIGH (ref 65–99)
Glucose-Capillary: 137 mg/dL — ABNORMAL HIGH (ref 65–99)
Glucose-Capillary: 112 mg/dL — ABNORMAL HIGH (ref 65–99)

## 2017-01-06 LAB — BLOOD GAS, ARTERIAL
ACID-BASE EXCESS: 11.3 mmol/L — AB (ref 0.0–2.0)
BICARBONATE: 35.5 mmol/L — AB (ref 20.0–28.0)
Drawn by: 345601
FIO2: 40
LHR: 15 {breaths}/min
MECHVT: 350 mL
O2 Saturation: 98.3 %
PEEP/CPAP: 5 cmH2O
PO2 ART: 111 mmHg — AB (ref 83.0–108.0)
Patient temperature: 98.7
pCO2 arterial: 47.7 mmHg (ref 32.0–48.0)
pH, Arterial: 7.484 — ABNORMAL HIGH (ref 7.350–7.450)

## 2017-01-06 LAB — MAGNESIUM: Magnesium: 2.1 mg/dL (ref 1.7–2.4)

## 2017-01-06 MED ORDER — POTASSIUM CHLORIDE 20 MEQ/15ML (10%) PO SOLN
40.0000 meq | Freq: Once | ORAL | Status: AC
  Administered 2017-01-06: 40 meq
  Filled 2017-01-06: qty 30

## 2017-01-06 MED ORDER — FUROSEMIDE 10 MG/ML IJ SOLN
40.0000 mg | Freq: Once | INTRAMUSCULAR | Status: AC
  Administered 2017-01-06: 40 mg via INTRAVENOUS
  Filled 2017-01-06: qty 4

## 2017-01-06 NOTE — Progress Notes (Signed)
PULMONARY / CRITICAL CARE MEDICINE   Name: Kristi Durham MRN: [id number] DOB: [birth year]    ADMISSION DATE:  12/29/2016 CONSULTATION DATE:  01/06/2017  REFERRING MD:  ED  CHIEF COMPLAINT:  Cardiac arrest Brief    History is obtained after speaking to her 2 sons and reviewing her medical record as well as the discharge summary from records from 39/3886. 83 year old presented from home after cardiac arrest. She was at her oncologist's office and was noted to be hypoxic to 90%, was brought back home and soon after sons had fed her via PEG, she developed agonal respirations and then stop breathing. EMS found her pulseless and required CPR with unknown downtime and epinephrine drip via IO before ROSC.  Past medical history of ischemic strokes and subarachnoid hemorrhage in 2015 was status post PEG due to dysphagia and malnutrition, partial complex seizures, colon cancer status post chemotherapy in remission for 8 years. Admission for/2018 [place] for hematemesis, found to have  Mallory-Weiss tear, found to have a sacral decubitus stage III   Imaging  STUDIES:  Head CT 6/26 >> Advanced age-related atrophy, white matter attenuation in left frontal region. Chest x-ray 6/26>> ET tube in place, bilateral basal opacities concerning for pneumonia. MRI head 6/28 > Rt cerebellar infarct, old left frontoparietal infarct. Changes consistent with prior [facility] EEG 6/27 > Diffuse slowing  Echo 6/28- LVEF 85-02%, grade 1 diastolic dysfunction, moderate pulmonary HTN UE [place] 6/27- No DVT I have reviewed all images personally.  CULTURES: Bcx 6/26 >  Sp CX 6/26 > GPC, GNR, GPR  ANTIBIOTICS: Vanco 6/26 >> 6/29 Cefepime 6/26 >>    LINES/TUBES: ETT 6/26 >> Lt IJ 6/27 >> I/O 6/26 >> 6/26   events 7/3 - No events overnight, no complaints, weaning on high PS    SUBJECTIVE/OVERNIGHT/INTERVAL HX 7/4 - daughter at bedside. She is caretaker. Many questions about weanability and cxr findings. Body  language implies full code with trach. Doing SBT.   VITAL SIGNS: BP 93/63   Pulse 85   Temp 99 F (37.2 C) (Axillary)   Resp 18   Ht 5\' 2"  (1.575 m)   Wt 42.6 kg (94 lb)   SpO2 99%   BMI 17.19 kg/m   HEMODYNAMICS: CVP:  [6 mmHg-9 mmHg] 6 mmHg  VENTILATOR SETTINGS: Vent Mode: PSV;CPAP FiO2 (%):  [40 %] 40 % Set Rate:  [15 bmp] 15 bmp Vt Set:  [350 mL] 350 mL PEEP:  [5 cmH20] 5 cmH20 Pressure Support:  [12 cmH20-15 cmH20] 12 cmH20 Plateau Pressure:  [19 cmH20-20 cmH20] 19 cmH20  INTAKE / OUTPUT: I/O last 3 completed shifts: In: 3300 [I.V.:380; NG/GT:1800; IV Piggyback:1120] Out: 68 [Urine:795]  PHYSICAL EXAMINATION:  General Appearance:    Very frrail and cachectic  Head:    Normocephalic, without obvious abnormality, atraumatic  Eyes:    PERRL - yes, conjunctiva/corneas - clear      Ears:    Normal external ear canals, both ears  Nose:   NG tube - no  Throat:  ETT TUBE - yes , OG tube - yes  Neck:   Supple,  No enlargement/tenderness/nodules. NECK IN SEVERE FLEXION     Lungs:     Clear to auscultation bilaterally, Ventilator   Synchrony - yes on PSV  Chest wall:    No deformity  Heart:    S1 and S2 normal, no murmur, CVP - no.  Pressors - no  Abdomen:     Soft, no masses, no organomegaly. PEG TUBE +  Genitalia:    Not done  Rectal:   not done  Extremities:   Extremities- CONTRACTURES +     Skin:   Intact in exposed areas . Sacral area - stage 3 baseline decub     Neurologic:   Sedation - no sedation -> RASS - -1 . Moves all 4s - sluggish movement on left. Occ nods appropriately     PULMONARY  Recent Labs Lab 01/05/17 0328 01/06/17 0312  PHART 7.643* 7.484*  PCO2ART 30.0* 47.7  PO2ART 181* 111*  HCO3 32.9* 35.5*  O2SAT 97.3 98.3    CBC  Recent Labs Lab 01/04/17 0406 01/05/17 0331 01/06/17 0546  HGB 6.6* 8.4* 8.8*  HCT 20.6* 26.4* 27.9*  WBC 10.3 12.3* 15.2*  PLT 346 331 348    COAGULATION No results for input(s): INR in the last 168  hours.  CARDIAC   Recent Labs Lab 12/31/16 0540  TROPONINI <0.03   No results for input(s): PROBNP in the last 168 hours.   CHEMISTRY  Recent Labs Lab 12/31/16 0540 12/31/16 1803  01/02/17 0337 01/03/17 0417 01/04/17 0406 01/05/17 0331 01/06/17 0546  NA 133*  --   < > 137 138 136 137 137  K 3.6  --   < > 3.7 3.2* 3.6 3.4* 3.7  CL 99*  --   < > 98* 97* 97* 98* 97*  CO2 27  --   < > 32 35* 34* 33* 34*  GLUCOSE 122*  --   < > 99 124* 121* 120* 129*  BUN 12  --   < > 17 24* 28* 29* 25*  CREATININE 0.35*  --   < > 0.31* 0.35* 0.40* 0.31* 0.30*  CALCIUM 7.8*  --   < > 8.4* 8.4* 8.1* 8.1* 8.0*  MG 1.8 1.9  --   --  1.6*  --  1.9 2.1  PHOS 1.7* 2.1*  --   --  2.2*  --  2.7 2.6  < > = values in this interval not displayed. Estimated Creatinine Clearance: 25.8 mL/min (A) (by C-G formula based on SCr of 0.3 mg/dL (L)).   LIVER No results for input(s): AST, ALT, ALKPHOS, BILITOT, PROT, ALBUMIN, INR in the last 168 hours.   INFECTIOUS  Recent Labs Lab 12/31/16 0540 01/01/17 0337  PROCALCITON 0.98 0.65     ENDOCRINE CBG (last 3)   Recent Labs  01/05/17 2320 01/06/17 0352 01/06/17 0831  GLUCAP 115* 103* 118*         IMAGING x48h  - image(s) personally visualized  -   highlighted in bold Dg Chest Port 1 View  Result Date: 01/06/2017 CLINICAL DATA:  Intubated EXAM: PORTABLE CHEST 1 VIEW COMPARISON:  01/05/2017 FINDINGS: Support devices are stable. Cardiomegaly with vascular congestion. Layering bilateral effusions and bilobed bilateral airspace opacities, unchanged. IMPRESSION: No significant change bilateral airspace disease and layering effusions. Electronically Signed   By: Rolm Baptise M.D.   On: 01/06/2017 07:52   Dg Chest Port 1 View  Result Date: 01/05/2017 CLINICAL DATA:  Intubation . EXAM: PORTABLE CHEST 1 VIEW COMPARISON:  01/04/2017 . FINDINGS: Endotracheal tube, NG tube, right IJ line stable position. Stable cardiomegaly. Diffuse bilateral pulmonary  infiltrates consistent pulmonary edema again noted. Bilateral pleural effusions again noted. No significant change. Low lung volumes. No pneumothorax. IMPRESSION: 1. Lines and tubes stable position. 2. Cardiomegaly with bilateral pulmonary edema and bilateral pleural effusions, no change from prior exam. 3. Low lung volumes. Electronically Signed   By: Marcello Moores  Register  On: 01/05/2017 07:18       DISCUSSION: 83 Y/O with respiratory arrest followed by cardiac arrest- etiology is unclear, could be related to aspiration or seizure. Chest x-ray shows bibasal consolidation/effusions.   ASSESSMENT / PLAN:  PULMONARY A: Respiratory arrest/acute respiratory failure Aspiration pneumonia  01/06/2017 - > does NOT meet criteria for SBT/Extubation in setting of Acute Respiratory Failure due to cardiac arrest   P:   PSV as tolerted Diurese 01/06/2017 No extubation 01/06/2017  Daughter indicating likely full code   CARDIOVASCULAR A:  Shock, likely cardiogenic versus septic EKG shows LAHB & prolonged qT Elevated troponin > trending down Lt arm swelling. No EVT  01/06/2017 ->  NOT on Pressor   P: LASIX x 1  Pressors as needed  RENAL A:   Hypokalemoa  P:   Replete K Replete lytes as indicated BMET in AM [facility] IVF  GASTROINTESTINAL A:   Severe protein calorie malnutrition, albumin 1.8 P:   Tube feeds via PEG  HEMATOLOGIC A:   Anemia of chronic disease P:  Follow CBC  INFECTIOUS A:   Aspiration pneumonia, recent hospitalization 10/2016 P:   Continue cefepime for total of 8 days Off vanco  ENDOCRINE A:   No issues   P:   CBG SSI  NEUROLOGIC A:   At risk Anoxic encephalopathy SAH + prior CVAs Acute rt cerebellar stroke  - follows some commands  P:   aspirin Continue supportive care.  D/C PRN sedation  FAMILY  - Updates: Spoke with daughter bedside.  After discussion, full code for now, will stop all sedation and if no response towards the end of the week then  will decide on trach vs extubation. On 01/06/2017  - full code  - Inter-disciplinary family meet or Palliative Care meeting due by:  day 7    The patient is critically ill with multiple organ systems failure and requires high complexity decision making for assessment and support, frequent evaluation and titration of therapies, application of advanced monitoring technologies and extensive interpretation of multiple databases.   Critical Care Time devoted to patient care services described in this note is  30  Minutes. This time reflects time of care of this signee Dr Brand Males. This critical care time does not reflect procedure time, or teaching time or supervisory time of PA/NP/Med student/Med Resident etc but could involve care discussion time    Dr. Brand Males, M.D., [facility].C.P Pulmonary and Critical Care Medicine Staff Physician [place] [facility] Pager: [phone], If no answer or between  15:00h - 7:00h: call [phone]  01/06/2017 10:13 AM

## 2017-01-07 ENCOUNTER — Inpatient Hospital Stay (HOSPITAL_COMMUNITY): Payer: Medicare Other

## 2017-01-07 DIAGNOSIS — E8770 Fluid overload, unspecified: Secondary | ICD-10-CM | POA: Diagnosis not present

## 2017-01-07 DIAGNOSIS — J90 Pleural effusion, not elsewhere classified: Secondary | ICD-10-CM | POA: Diagnosis not present

## 2017-01-07 DIAGNOSIS — I69254 Hemiplegia and hemiparesis following other nontraumatic intracranial hemorrhage affecting left non-dominant side: Secondary | ICD-10-CM | POA: Diagnosis not present

## 2017-01-07 DIAGNOSIS — J811 Chronic pulmonary edema: Secondary | ICD-10-CM | POA: Diagnosis not present

## 2017-01-07 DIAGNOSIS — Z85038 Personal history of other malignant neoplasm of large intestine: Secondary | ICD-10-CM | POA: Diagnosis not present

## 2017-01-07 DIAGNOSIS — A419 Sepsis, unspecified organism: Secondary | ICD-10-CM | POA: Diagnosis not present

## 2017-01-07 DIAGNOSIS — Z4682 Encounter for fitting and adjustment of non-vascular catheter: Secondary | ICD-10-CM | POA: Diagnosis not present

## 2017-01-07 DIAGNOSIS — Z452 Encounter for adjustment and management of vascular access device: Secondary | ICD-10-CM | POA: Diagnosis not present

## 2017-01-07 DIAGNOSIS — Z515 Encounter for palliative care: Secondary | ICD-10-CM | POA: Diagnosis not present

## 2017-01-07 DIAGNOSIS — R4189 Other symptoms and signs involving cognitive functions and awareness: Secondary | ICD-10-CM | POA: Diagnosis not present

## 2017-01-07 DIAGNOSIS — J918 Pleural effusion in other conditions classified elsewhere: Secondary | ICD-10-CM | POA: Diagnosis not present

## 2017-01-07 DIAGNOSIS — Z9221 Personal history of antineoplastic chemotherapy: Secondary | ICD-10-CM | POA: Diagnosis not present

## 2017-01-07 DIAGNOSIS — J939 Pneumothorax, unspecified: Secondary | ICD-10-CM | POA: Diagnosis not present

## 2017-01-07 DIAGNOSIS — J9621 Acute and chronic respiratory failure with hypoxia: Secondary | ICD-10-CM | POA: Diagnosis present

## 2017-01-07 DIAGNOSIS — E049 Nontoxic goiter, unspecified: Secondary | ICD-10-CM | POA: Diagnosis not present

## 2017-01-07 DIAGNOSIS — J9601 Acute respiratory failure with hypoxia: Secondary | ICD-10-CM | POA: Diagnosis not present

## 2017-01-07 DIAGNOSIS — R579 Shock, unspecified: Secondary | ICD-10-CM | POA: Diagnosis not present

## 2017-01-07 DIAGNOSIS — I469 Cardiac arrest, cause unspecified: Secondary | ICD-10-CM | POA: Diagnosis not present

## 2017-01-07 DIAGNOSIS — J9381 Chronic pneumothorax: Secondary | ICD-10-CM | POA: Diagnosis not present

## 2017-01-07 DIAGNOSIS — I442 Atrioventricular block, complete: Secondary | ICD-10-CM | POA: Diagnosis not present

## 2017-01-07 DIAGNOSIS — R0600 Dyspnea, unspecified: Secondary | ICD-10-CM | POA: Diagnosis not present

## 2017-01-07 DIAGNOSIS — M6281 Muscle weakness (generalized): Secondary | ICD-10-CM | POA: Diagnosis not present

## 2017-01-07 DIAGNOSIS — R2689 Other abnormalities of gait and mobility: Secondary | ICD-10-CM | POA: Diagnosis not present

## 2017-01-07 DIAGNOSIS — Z931 Gastrostomy status: Secondary | ICD-10-CM | POA: Diagnosis not present

## 2017-01-07 DIAGNOSIS — G464 Cerebellar stroke syndrome: Secondary | ICD-10-CM | POA: Diagnosis not present

## 2017-01-07 DIAGNOSIS — I639 Cerebral infarction, unspecified: Secondary | ICD-10-CM | POA: Diagnosis not present

## 2017-01-07 DIAGNOSIS — R918 Other nonspecific abnormal finding of lung field: Secondary | ICD-10-CM | POA: Diagnosis not present

## 2017-01-07 DIAGNOSIS — J95811 Postprocedural pneumothorax: Secondary | ICD-10-CM | POA: Diagnosis not present

## 2017-01-07 DIAGNOSIS — J9620 Acute and chronic respiratory failure, unspecified whether with hypoxia or hypercapnia: Secondary | ICD-10-CM | POA: Diagnosis not present

## 2017-01-07 DIAGNOSIS — R739 Hyperglycemia, unspecified: Secondary | ICD-10-CM | POA: Diagnosis not present

## 2017-01-07 DIAGNOSIS — J9600 Acute respiratory failure, unspecified whether with hypoxia or hypercapnia: Secondary | ICD-10-CM | POA: Diagnosis not present

## 2017-01-07 DIAGNOSIS — Z431 Encounter for attention to gastrostomy: Secondary | ICD-10-CM | POA: Diagnosis not present

## 2017-01-07 DIAGNOSIS — L89154 Pressure ulcer of sacral region, stage 4: Secondary | ICD-10-CM | POA: Diagnosis present

## 2017-01-07 DIAGNOSIS — D638 Anemia in other chronic diseases classified elsewhere: Secondary | ICD-10-CM | POA: Diagnosis not present

## 2017-01-07 DIAGNOSIS — R5381 Other malaise: Secondary | ICD-10-CM | POA: Diagnosis not present

## 2017-01-07 DIAGNOSIS — E872 Acidosis: Secondary | ICD-10-CM | POA: Diagnosis not present

## 2017-01-07 DIAGNOSIS — Z9911 Dependence on respirator [ventilator] status: Secondary | ICD-10-CM | POA: Diagnosis not present

## 2017-01-07 DIAGNOSIS — I471 Supraventricular tachycardia: Secondary | ICD-10-CM | POA: Diagnosis not present

## 2017-01-07 DIAGNOSIS — J9811 Atelectasis: Secondary | ICD-10-CM | POA: Diagnosis not present

## 2017-01-07 DIAGNOSIS — J9383 Other pneumothorax: Secondary | ICD-10-CM | POA: Diagnosis not present

## 2017-01-07 DIAGNOSIS — R6521 Severe sepsis with septic shock: Secondary | ICD-10-CM | POA: Diagnosis not present

## 2017-01-07 DIAGNOSIS — Z7189 Other specified counseling: Secondary | ICD-10-CM | POA: Diagnosis not present

## 2017-01-07 DIAGNOSIS — G9349 Other encephalopathy: Secondary | ICD-10-CM | POA: Diagnosis present

## 2017-01-07 DIAGNOSIS — K226 Gastro-esophageal laceration-hemorrhage syndrome: Secondary | ICD-10-CM | POA: Diagnosis not present

## 2017-01-07 DIAGNOSIS — T8182XA Emphysema (subcutaneous) resulting from a procedure, initial encounter: Secondary | ICD-10-CM | POA: Diagnosis not present

## 2017-01-07 DIAGNOSIS — R1319 Other dysphagia: Secondary | ICD-10-CM | POA: Diagnosis not present

## 2017-01-07 DIAGNOSIS — I959 Hypotension, unspecified: Secondary | ICD-10-CM | POA: Diagnosis not present

## 2017-01-07 DIAGNOSIS — G9341 Metabolic encephalopathy: Secondary | ICD-10-CM | POA: Diagnosis present

## 2017-01-07 DIAGNOSIS — L89153 Pressure ulcer of sacral region, stage 3: Secondary | ICD-10-CM | POA: Diagnosis not present

## 2017-01-07 DIAGNOSIS — L89152 Pressure ulcer of sacral region, stage 2: Secondary | ICD-10-CM | POA: Diagnosis not present

## 2017-01-07 DIAGNOSIS — I679 Cerebrovascular disease, unspecified: Secondary | ICD-10-CM | POA: Diagnosis not present

## 2017-01-07 DIAGNOSIS — E876 Hypokalemia: Secondary | ICD-10-CM | POA: Diagnosis not present

## 2017-01-07 DIAGNOSIS — G9340 Encephalopathy, unspecified: Secondary | ICD-10-CM | POA: Diagnosis not present

## 2017-01-07 DIAGNOSIS — J181 Lobar pneumonia, unspecified organism: Secondary | ICD-10-CM | POA: Diagnosis not present

## 2017-01-07 DIAGNOSIS — J189 Pneumonia, unspecified organism: Secondary | ICD-10-CM | POA: Diagnosis not present

## 2017-01-07 DIAGNOSIS — E162 Hypoglycemia, unspecified: Secondary | ICD-10-CM | POA: Diagnosis not present

## 2017-01-07 DIAGNOSIS — R64 Cachexia: Secondary | ICD-10-CM | POA: Diagnosis not present

## 2017-01-07 DIAGNOSIS — I509 Heart failure, unspecified: Secondary | ICD-10-CM | POA: Diagnosis not present

## 2017-01-07 DIAGNOSIS — E46 Unspecified protein-calorie malnutrition: Secondary | ICD-10-CM | POA: Diagnosis not present

## 2017-01-07 DIAGNOSIS — Z681 Body mass index (BMI) 19 or less, adult: Secondary | ICD-10-CM | POA: Diagnosis not present

## 2017-01-07 DIAGNOSIS — I5032 Chronic diastolic (congestive) heart failure: Secondary | ICD-10-CM | POA: Diagnosis present

## 2017-01-07 DIAGNOSIS — J9622 Acute and chronic respiratory failure with hypercapnia: Secondary | ICD-10-CM | POA: Diagnosis not present

## 2017-01-07 DIAGNOSIS — Z978 Presence of other specified devices: Secondary | ICD-10-CM | POA: Diagnosis not present

## 2017-01-07 DIAGNOSIS — J982 Interstitial emphysema: Secondary | ICD-10-CM | POA: Diagnosis not present

## 2017-01-07 DIAGNOSIS — J156 Pneumonia due to other aerobic Gram-negative bacteria: Secondary | ICD-10-CM | POA: Diagnosis not present

## 2017-01-07 DIAGNOSIS — R000 Tachycardia, unspecified: Secondary | ICD-10-CM | POA: Diagnosis not present

## 2017-01-07 DIAGNOSIS — J439 Emphysema, unspecified: Secondary | ICD-10-CM | POA: Diagnosis not present

## 2017-01-07 DIAGNOSIS — J9690 Respiratory failure, unspecified, unspecified whether with hypoxia or hypercapnia: Secondary | ICD-10-CM | POA: Diagnosis not present

## 2017-01-07 DIAGNOSIS — K9423 Gastrostomy malfunction: Secondary | ICD-10-CM | POA: Diagnosis not present

## 2017-01-07 DIAGNOSIS — E43 Unspecified severe protein-calorie malnutrition: Secondary | ICD-10-CM | POA: Diagnosis present

## 2017-01-07 DIAGNOSIS — E871 Hypo-osmolality and hyponatremia: Secondary | ICD-10-CM | POA: Diagnosis not present

## 2017-01-07 DIAGNOSIS — G931 Anoxic brain damage, not elsewhere classified: Secondary | ICD-10-CM | POA: Diagnosis present

## 2017-01-07 DIAGNOSIS — J690 Pneumonitis due to inhalation of food and vomit: Secondary | ICD-10-CM | POA: Diagnosis not present

## 2017-01-07 LAB — BASIC METABOLIC PANEL
ANION GAP: 7 (ref 5–15)
BUN: 31 mg/dL — ABNORMAL HIGH (ref 6–20)
CHLORIDE: 97 mmol/L — AB (ref 101–111)
CO2: 35 mmol/L — ABNORMAL HIGH (ref 22–32)
Calcium: 8 mg/dL — ABNORMAL LOW (ref 8.9–10.3)
Creatinine, Ser: 0.39 mg/dL — ABNORMAL LOW (ref 0.44–1.00)
GFR calc Af Amer: >60 mL/min (ref >60)
GLUCOSE: 107 mg/dL — AB (ref 65–99)
POTASSIUM: 4 mmol/L (ref 3.5–5.1)
Sodium: 139 mmol/L (ref 135–145)

## 2017-01-07 LAB — GLUCOSE, CAPILLARY
GLUCOSE-CAPILLARY: 102 mg/dL — AB (ref 65–99)
Glucose-Capillary: 117 mg/dL — ABNORMAL HIGH (ref 65–99)
Glucose-Capillary: 131 mg/dL — ABNORMAL HIGH (ref 65–99)
Glucose-Capillary: 105 mg/dL — ABNORMAL HIGH (ref 65–99)

## 2017-01-07 LAB — CBC
HCT: 27.5 % — ABNORMAL LOW (ref 36.0–46.0)
HEMOGLOBIN: 8.6 g/dL — AB (ref 12.0–15.0)
MCH: 24.1 pg — ABNORMAL LOW (ref 26.0–34.0)
MCHC: 31.3 g/dL (ref 30.0–36.0)
MCV: 77 fL — AB (ref 78.0–100.0)
Platelets: 357 10*3/uL (ref 150–400)
RBC: 3.57 MIL/uL — ABNORMAL LOW (ref 3.87–5.11)
RDW: 19.4 % — ABNORMAL HIGH (ref 11.5–15.5)
WBC: 14 10*3/uL — ABNORMAL HIGH (ref 4.0–10.5)

## 2017-01-07 LAB — HEPATIC FUNCTION PANEL
ALBUMIN: 1.7 g/dL — AB (ref 3.5–5.0)
ALT: 84 U/L — ABNORMAL HIGH (ref 14–54)
AST: 129 U/L — AB (ref 15–41)
Alkaline Phosphatase: 107 U/L (ref 38–126)
BILIRUBIN TOTAL: 0.5 mg/dL (ref 0.3–1.2)
Bilirubin, Direct: 0.2 mg/dL (ref 0.1–0.5)
Indirect Bilirubin: 0.3 mg/dL (ref 0.3–0.9)
Total Protein: 5.7 g/dL — ABNORMAL LOW (ref 6.5–8.1)

## 2017-01-07 LAB — PHOSPHORUS: Phosphorus: 2.7 mg/dL (ref 2.5–4.6)

## 2017-01-07 LAB — MAGNESIUM: MAGNESIUM: 2 mg/dL (ref 1.7–2.4)

## 2017-01-07 MED ORDER — ORAL CARE MOUTH RINSE: 15.0000 mL | OROMUCOSAL | 0 refills | Status: AC

## 2017-01-07 MED ORDER — PANTOPRAZOLE SODIUM 40 MG PO PACK: 40.0000 mg | PACK | Freq: Every day | ORAL | Status: AC

## 2017-01-07 MED ORDER — CHLORHEXIDINE GLUCONATE 0.12% ORAL RINSE (MEDLINE KIT): 15.0000 mL | Freq: Two times a day (BID) | OROMUCOSAL | 0 refills | Status: AC

## 2017-01-07 MED ORDER — INSULIN ASPART 100 UNIT/ML ~~LOC~~ SOLN: 0.0000 [IU] | SUBCUTANEOUS | 11 refills | Status: AC

## 2017-01-07 MED ORDER — SODIUM CHLORIDE 0.9 % IV SOLN
10.0000 mL | INTRAVENOUS | 0 refills | Status: AC
Start: 2017-01-07 — End: ?

## 2017-01-07 MED ORDER — ASPIRIN 325 MG PO TABS: 325.0000 mg | ORAL_TABLET | Freq: Every day | ORAL | Status: AC

## 2017-01-07 MED ORDER — CHLORHEXIDINE GLUCONATE CLOTH 2 % EX PADS: 6.0000 | MEDICATED_PAD | Freq: Every day | CUTANEOUS | Status: AC

## 2017-01-07 MED ORDER — SODIUM CHLORIDE 0.9% FLUSH
10.0000 mL | Freq: Two times a day (BID) | INTRAVENOUS | Status: AC
Start: 2017-01-07 — End: ?

## 2017-01-07 MED ORDER — SODIUM CHLORIDE 0.9% FLUSH: 10.0000 mL | INTRAVENOUS | Status: AC | PRN

## 2017-01-07 MED ORDER — VITAL HIGH PROTEIN PO LIQD
1000.0000 mL | ORAL | Status: AC
Start: 2017-01-07 — End: ?

## 2017-01-07 MED ORDER — ADULT MULTIVITAMIN LIQUID CH: 15.0000 mL | Freq: Every day | ORAL | Status: AC

## 2017-01-07 NOTE — Progress Notes (Addendum)
PULMONARY / CRITICAL CARE MEDICINE   Name: Kristi Durham MRN: [id number] DOB: 1914-12-21    ADMISSION DATE:  12/29/2016 CONSULTATION DATE:  01/07/2017  REFERRING MD:  ED  CHIEF COMPLAINT:  Cardiac arrest Brief    History is obtained after speaking to her 2 sons and reviewing her medical record as well as the discharge summary from records from 31/6141. 83 year old presented from home after cardiac arrest. She was at her oncologist's office and was noted to be hypoxic to 90%, was brought back home and soon after sons had fed her via PEG, she developed agonal respirations and then stop breathing. EMS found her pulseless and required CPR with unknown downtime and epinephrine drip via IO before ROSC.  Past medical history of ischemic strokes and subarachnoid hemorrhage in 2015 was status post PEG due to dysphagia and malnutrition, partial complex seizures, colon cancer status post chemotherapy in remission for 8 years. Admission for/2018 [place] for hematemesis, found to have  Mallory-Weiss tear, found to have a sacral decubitus stage III  Imaging  STUDIES:  Head CT 6/26 >> Advanced age-related atrophy, white matter attenuation in left frontal region. Chest x-ray 6/26>> ET tube in place, bilateral basal opacities concerning for pneumonia. MRI head 6/28 > Rt cerebellar infarct, old left frontoparietal infarct. Changes consistent with prior [facility] EEG 6/27 > Diffuse slowing  Echo 6/28- LVEF 53-61%, grade 1 diastolic dysfunction, moderate pulmonary HTN UE [place] 6/27- No DVT I have reviewed all images personally.  CULTURES: Bcx 6/26 >  Sp CX 6/26 > GPC, GNR, GPR  ANTIBIOTICS: Vanco 6/26 >> 6/29 Cefepime 6/26 >>  LINES/TUBES: ETT 6/26 >> Lt IJ 6/27 >> I/O 6/26 >> 6/26  events 7/3 - No events overnight, no complaints, weaning on high PS  SUBJECTIVE/OVERNIGHT/INTERVAL HX Not doing well on PSV trials. Mental status continues to be poor.   VITAL SIGNS: BP (!) 91/54   Pulse 76    Temp 99.3 F (37.4 C) (Oral)   Resp (!) 24   Ht 5\' 2"  (1.575 m)   Wt 92 lb (41.7 kg)   SpO2 99%   BMI 16.83 kg/m   HEMODYNAMICS: CVP:  [6 mmHg-10 mmHg] 10 mmHg  VENTILATOR SETTINGS: Vent Mode: PRVC FiO2 (%):  [30 %-40 %] 30 % Set Rate:  [15 bmp] 15 bmp Vt Set:  [350 mL] 350 mL PEEP:  [5 cmH20] 5 cmH20 Plateau Pressure:  [18 cmH20-33 cmH20] 23 cmH20  INTAKE / OUTPUT: I/O last 3 completed shifts: In: 2105 [I.V.:390; WE/RX:5400; IV Piggyback:50] Out: 867 [Urine:920]  PHYSICAL EXAMINATION: Blood pressure (!) 91/54, pulse 76, temperature 99.3 F (37.4 C), temperature source Oral, resp. rate (!) 24, height 5\' 2"  (1.575 m), weight 92 lb (41.7 kg), SpO2 99 %. Gen:      Frail, catechetic.  HEENT:  EOMI, sclera anicteric Neck:     No masses; no thyromegaly Lungs:    Clear to auscultation bilaterally; normal respiratory effort CV:         Regular rate and rhythm; no murmurs Abd:      + bowel sounds; soft, non-tender; no palpable masses, no distension Ext:    2-3+ edema, anasarca; adequate peripheral perfusion Skin:      Warm and dry; no rash Neuro: No response  PULMONARY  Recent Labs Lab 01/05/17 0328 01/06/17 0312  PHART 7.643* 7.484*  PCO2ART 30.0* 47.7  PO2ART 181* 111*  HCO3 32.9* 35.5*  O2SAT 97.3 98.3    CBC  Recent Labs Lab 01/05/17 0331 01/06/17 0546  01/07/17 0415  HGB 8.4* 8.8* 8.6*  HCT 26.4* 27.9* 27.5*  WBC 12.3* 15.2* 14.0*  PLT 331 348 357    COAGULATION No results for input(s): INR in the last 168 hours.  CARDIAC  No results for input(s): TROPONINI in the last 168 hours. No results for input(s): PROBNP in the last 168 hours.   CHEMISTRY  Recent Labs Lab 12/31/16 1803  01/03/17 0417 01/04/17 0406 01/05/17 0331 01/06/17 0546 01/07/17 0047 01/07/17 0415  NA  --   < > 138 136 137 137 139  --   K  --   < > 3.2* 3.6 3.4* 3.7 4.0  --   CL  --   < > 97* 97* 98* 97* 97*  --   CO2  --   < > 35* 34* 33* 34* 35*  --   GLUCOSE  --   < >  124* 121* 120* 129* 107*  --   BUN  --   < > 24* 28* 29* 25* 31*  --   CREATININE  --   < > 0.35* 0.40* 0.31* 0.30* 0.39*  --   CALCIUM  --   < > 8.4* 8.1* 8.1* 8.0* 8.0*  --   MG 1.9  --  1.6*  --  1.9 2.1  --  2.0  PHOS 2.1*  --  2.2*  --  2.7 2.6  --  2.7  < > = values in this interval not displayed. Estimated Creatinine Clearance: 25.2 mL/min (A) (by C-G formula based on SCr of 0.39 mg/dL (L)).   LIVER  Recent Labs Lab 01/07/17 0415  AST 129*  ALT 84*  ALKPHOS 107  BILITOT 0.5  PROT 5.7*  ALBUMIN 1.7*     INFECTIOUS  Recent Labs Lab 01/01/17 0337  PROCALCITON 0.65     ENDOCRINE CBG (last 3)   Recent Labs  01/06/17 2328 01/07/17 0418 01/07/17 0734  GLUCAP 112* 117* 102*    IMAGING x48h  - image(s) personally visualized  -   highlighted in bold Dg Chest Port 1 View  Result Date: 01/07/2017 CLINICAL DATA:  ETT.HX STROKE,COLON CA EXAM: PORTABLE CHEST 1 VIEW COMPARISON:  One day prior FINDINGS: Endotracheal tube terminates 2.3 Cm above carina.nasogastric tube extends beyond the inferior aspect of the film. Right internal jugular line tip at low SVC. Cardiomegaly accentuated by AP portable technique. Small bilateral pleural effusions are similar. There may be mild loculation involving the left-sided effusion. The Chin overlies the left apex. No pneumothorax. Interstitial edema is moderate and similar. Left greater than right base airspace disease is not significantly changed. Atherosclerosis in the transverse aorta. IMPRESSION: No significant change since one day prior. Congestive heart failure with bilateral pleural effusions and bibasilar Airspace disease, likely atelectasis. Electronically Signed   By: Abigail Miyamoto M.D.   On: 01/07/2017 07:20   Dg Chest Port 1 View  Result Date: 01/06/2017 CLINICAL DATA:  Intubated EXAM: PORTABLE CHEST 1 VIEW COMPARISON:  01/05/2017 FINDINGS: Support devices are stable. Cardiomegaly with vascular congestion. Layering bilateral  effusions and bilobed bilateral airspace opacities, unchanged. IMPRESSION: No significant change bilateral airspace disease and layering effusions. Electronically Signed   By: Rolm Baptise M.D.   On: 01/06/2017 07:52    DISCUSSION: 83 Y/O with respiratory arrest followed by cardiac arrest- etiology is unclear, could be related to aspiration or seizure. Chest x-ray shows bibasal consolidation/effusions.   ASSESSMENT / PLAN:  PULMONARY A: Respiratory arrest/acute respiratory failure Aspiration pneumonia P:   Not progressing on  weaning trials Continue vent support  CARDIOVASCULAR A:  Shock, likely cardiogenic versus septic EKG shows LAHB & prolonged qT Elevated troponin > trending down Lt arm swelling. No DVT P: Hold lasix as BP is soft.   RENAL A:   Hypokalemia- resolved P:   Monitor urine output and Cr.  GASTROINTESTINAL A:   Severe protein calorie malnutrition, albumin 1.8 P:   Tube feeds via PEG.  HEMATOLOGIC A:   Anemia of chronic disease P:  Follow CBC  INFECTIOUS A:   Aspiration pneumonia, recent hospitalization 10/2016 P:   Stop antibiotics as she has received 10 days of therapy  ENDOCRINE A:   No issues   P:   CBG SSI coverage  NEUROLOGIC A:   At risk Anoxic encephalopathy SAH + prior CVAs Acute rt cerebellar stroke P:   Continue aspirin Supportive care  FAMILY  - Updates: Plan for a family meeting with palliative care. I would not recommend a trach as the chances of her home are nil. She will need to be in some facility and require at least intermittent vent support for the rest of her life. I have told this to the son. If they still wish to proceed with the trach then we may have to consider ethics to be involved.   - Inter-disciplinary family meet or Palliative Care meeting due by:  day 7  The patient is critically ill with multiple organ system failure and requires high complexity decision making for assessment and support, frequent  evaluation and titration of therapies, advanced monitoring, review of radiographic studies and interpretation of complex data.   Critical Care Time devoted to patient care services, exclusive of separately billable procedures, described in this note is 35 minutes.   Marshell Garfinkel MD [facility] Pager [phone] If no answer or after 3pm call: [phone] 01/07/2017, 11:29 AM

## 2017-01-07 NOTE — Care Management Note (Addendum)
Case Management Note Marvetta Gibbons RN, BSN Unit 2W-Case Manager-- [place] coverage [phone]  Patient Details  Name: Kristi Durham MRN: [id number] Date of Birth: February 17, 1915  Subjective/Objective:   Pt admitted s/p cardiac arrest- currently on vent.               Action/Plan: PTA pt lived at home with family (sister is main caregiver)-  Hx of Peg tube with home TF- CM will follow for d/c needs   Expected Discharge Date:  01/07/17               Expected Discharge Plan:  Long Term Acute Care (LTAC)  In-House Referral:     Discharge planning Services  CM Consult  Post Acute Care Choice:  Long Term Acute Care (LTAC) Choice offered to:  Adult Children  DME Arranged:    DME Agency:     HH Arranged:    HH Agency:     Status of Service:  Completed, signed off  If discussed at [place] of Stay Meetings, dates discussed:    Discharge Disposition: [place]  Additional Comments:  01/07/17 J. Siler Mavis, RN, BSN CM Referral for [employer]; pt family desires to continue aggressive measures to give pt time to improve, knowing that this may be a lengthy process.  They are interested in [place], with [facility] as first choice.  Referral made to L. Wynetta Emery, admissions liaison for [facility].  He is on his way to hospital to meet with family.  Will follow.    01/07/17 J. Lanecia Sliva, RN, BSN  1500 Family has spoken with [employer], and are agreeable to discharge to [facility] today.  MD to dc and do discharge summary.  Will arrange transport to LTAC via [facility] transport.    01/07/17 J. Mialynn Shelvin, RN, BSN [facility] transport has been arranged; ETA for pickup approximately 6pm.  DC packet complete; bedside RN to call report to nurse at [facility] at [phone].     Reinaldo Raddle, RN, BSN  Trauma/Neuro ICU Case Manager [phone]

## 2017-01-07 NOTE — Discharge Summary (Signed)
Physician Discharge Summary  Patient ID: Kristi Durham MRN: [id number] DOB/AGE: [birth year] 83 y.o.  Admit date: 12/29/2016 Discharge date: 01/07/2017    Discharge Diagnoses:  Respiratory arrest/acute hypoxic respiratory failure - s/p intubation with inability to wean from vent.   Aspiration pneumonia - s/p abx. Shock, likely septic due to aspiration - resolved s/p abx. Volume overload - has not been trialed on lasix due to borderline BP's. Severe protein calorie malnutrition, albumin 1.8. Anemia of chronic disease. Aspiration pneumonia, recent hospitalization 10/2016 - s/p 10 days abx therapy. Hyperglycemia. Concern for anoxic encephalopathy - has not required any sedation post intubation and remains essentially unresponsive (although confounded due to acute CVA). Acute rt cerebellar stroke Hx SAH and prior CVA.                                                                       DISCHARGE PLAN BY DIAGNOSIS    Respiratory arrest/acute hypoxic respiratory failure - s/p intubation with inability to wean from vent.   Lurline Idol is desired by family is to be disucssed further at [place]. Aspiration pneumonia - s/p abx. P:   Continue vent support with weaning trials. Pulmonary hygiene. Follow CXR.  Shock, likely septic due to aspiration - resolved s/p abx. Volume overload - has not been trialed on lasix due to borderline BP's. P: Hold lasix as BP is soft.   Severe protein calorie malnutrition, albumin 1.8. P:   Tube feeds via PEG.  Anemia of chronic disease. P:  Follow CBC. Transfuse for Hgb < 7.  Aspiration pneumonia, recent hospitalization 10/2016 - s/p 10 days abx therapy. P:   No further interventions required.  Hyperglycemia. P:   SSI coverage.  Concern for anoxic encephalopathy - has not required any sedation post intubation and remains essentially unresponsive (although confounded due to acute CVA). Acute rt cerebellar stroke Hx SAH and prior CVA. P:    Continue aspirin. Supportive care.                DISCHARGE SUMMARY   Kristi Durham is a 83 y.o. y/o female with a PMH of ischemic stroke and SAH in 2015, dysphagia and malnutrition s/p PEG, partial complex seizures, colon CA s/p chemo in remission for 8 years, admitted to [facility] 12/29/16 from home after cardiac arrest. She was at her oncologist's office and was noted to be hypoxic to 90%, was brought back home and soon after sons had fed her via PEG, she developed agonal respirations and then stop breathing. EMS found her pulseless and required CPR with unknown downtime and epinephrine drip via IO before ROSC.  In ED, she was intubated for airway protection.  She had multiple diagnostic studies as outlined below.  Neurology and palliative care were consulted after she failed multiple weaning attempts. Multiple discussions were had with family members regarding plan and goals of care moving forward mainly in regards to comfort care / terminal extubation vs proceeding with tracheostomy.  Daughter and son were waiting for more family to come into town; therefore, decision wasn't made for several days.  On 7/5, family informed medical team that they would like to proceed with tracheostomy despite Kristi fact that Kristi Durham would require placement in a facility.  [facility] was contacted and they  were willing to accept Kristi Durham in transfer.  On afternoon of 7/5, she was deemed to be medically stable and was cleared for discharge to [facility] ICU.   SIGNIFICANT DIAGNOSTIC STUDIES Head CT 6/26 > Advanced age-related atrophy, white matter attenuation in left frontal region. CXR  6/26 > ET tube in place, bilateral basal opacities concerning for pneumonia. MRI head 6/28 > Rt cerebellar infarct, old left frontoparietal infarct. Changes consistent with prior SAH. EEG 6/27 > Diffuse slowing.  Echo 6/28 > LVEF 60-63%, grade 1 diastolic dysfunction, moderate pulmonary HTN. UE [place] 6/27 > No DVT.  SIGNIFICANT  EVENTS 6/26 > admit. 7/5 > discharge to [facility].  MICRO DATA  Blood 6/26 > neg. Sputum 6/26 > neg. Urine 6/26 > yeast.  ANTIBIOTICS Cefepime 6/26 > 7/5. Vanc 6/26 > 6/29.  CONSULTS Palliative Care. Neurology.  TUBES / LINES ETT 6/26 > Lt IJ 6/27 > I/O 6/26 > 6/26  Discharge Exam: General: Frail elderly female, unresponsive. Neuro: Unresponsive, does not follow commands. HEENT: [place]/AT. PERRL, arcus senilis. Cardiovascular: RRR, no M/R/G.  Lungs: Respirations even and unlabored.  CTA bilaterally, No W/R/R. Abdomen: BS x 4, soft, NT/ND.  Musculoskeletal: No gross deformities, no edema.  Skin: Intact, warm, no rashes.   Vitals:   01/07/17 1220 01/07/17 1300 01/07/17 1400 01/07/17 1500  BP: (!) 87/59 (!) 89/62 (!) 85/57 (!) 89/61  Pulse: 82 79 76 83  Resp: 20 (!) 21 20 18   Temp:      TempSrc:      SpO2: 100% 99% 100% 100%  Weight:      Height:         Discharge Labs  BMET  Recent Labs Lab 12/31/16 1803  01/03/17 0417 01/04/17 0406 01/05/17 0331 01/06/17 0546 01/07/17 0047 01/07/17 0415  NA  --   < > 138 136 137 137 139  --   K  --   < > 3.2* 3.6 3.4* 3.7 4.0  --   CL  --   < > 97* 97* 98* 97* 97*  --   CO2  --   < > 35* 34* 33* 34* 35*  --   GLUCOSE  --   < > 124* 121* 120* 129* 107*  --   BUN  --   < > 24* 28* 29* 25* 31*  --   CREATININE  --   < > 0.35* 0.40* 0.31* 0.30* 0.39*  --   CALCIUM  --   < > 8.4* 8.1* 8.1* 8.0* 8.0*  --   MG 1.9  --  1.6*  --  1.9 2.1  --  2.0  PHOS 2.1*  --  2.2*  --  2.7 2.6  --  2.7  < > = values in this interval not displayed.  CBC  Recent Labs Lab 01/05/17 0331 01/06/17 0546 01/07/17 0415  HGB 8.4* 8.8* 8.6*  HCT 26.4* 27.9* 27.5*  WBC 12.3* 15.2* 14.0*  PLT 331 348 357    Anti-Coagulation No results for input(s): INR in Kristi last 168 hours.    Allergies as of 01/07/2017   No Known Allergies     Medication List    TAKE these medications   aspirin 325 MG tablet Place 1 tablet (325 mg total) into  feeding tube daily. Start taking on:  01/08/2017   chlorhexidine gluconate (MEDLINE KIT) 0.12 % solution Commonly known as:  PERIDEX 15 mLs by Mouth Rinse route 2 (two) times daily.   Chlorhexidine Gluconate Cloth 2 % Pads Apply  6 each topically daily.   feeding supplement (VITAL HIGH PROTEIN) Liqd liquid Place 1,000 mLs into feeding tube continuous.   insulin aspart 100 UNIT/ML injection Commonly known as:  novoLOG Inject 0-9 Units into Kristi skin every 4 (four) hours.   mouth rinse Liqd solution 15 mLs by Mouth Rinse route every 2 (two) hours.   multivitamin Liqd Place 15 mLs into feeding tube daily. Start taking on:  01/08/2017   pantoprazole sodium 40 mg/20 mL Pack Commonly known as:  PROTONIX Place 20 mLs (40 mg total) into feeding tube at bedtime.   sodium chloride 0.9 % infusion Inject 10 mLs into Kristi vein continuous.   sodium chloride flush 0.9 % Soln Commonly known as:  NS 10-40 mLs by Intracatheter route every 12 (twelve) hours.   sodium chloride flush 0.9 % Soln Commonly known as:  NS 10-40 mLs by Intracatheter route as needed (flush).        Disposition: [facility] ICU.  Discharged Condition: Kristi Durham has met maximum benefit of inpatient care and is medically stable and cleared for discharge.  Patient is pending follow up as above.      Time spent on disposition:  Greater than 35 minutes.   Montey Hora, [facility] Pgr: [phone]  or [phone]

## 2017-01-07 NOTE — Progress Notes (Signed)
Daily Progress Note   Patient Name: Kristi Durham       Date: 01/07/2017 DOB: 11-08-1914  Age: 83 y.o. MRN#: [id number] Attending Physician: Marshell Garfinkel, MD Primary Care Physician: Lajean Manes, MD Admit Date: 12/29/2016  Reason for Consultation/Follow-up: Disposition, Establishing goals of care and Psychosocial/spiritual support  Subjective: Martavia remains unresponsive during my evaluation. Her son is at the bedside and relates that she has been more interactive in the evening. He reports that last night she was following simple commands, including squeezing hands, moving her leg, and attempting to stick out her tongue. I could not elicit any response this morning. She has also been unable to wean off the vent, this morning weaning attempted and she did not breathe on her own.    Length of Stay: 9  Current Medications: Scheduled Meds:  . aspirin  325 mg Per Tube Daily  . chlorhexidine gluconate (MEDLINE KIT)  15 mL Mouth Rinse BID  . Chlorhexidine Gluconate Cloth  6 each Topical Daily  . insulin aspart  0-9 Units Subcutaneous Q4H  . mouth rinse  15 mL Mouth Rinse 10 times per day  . multivitamin  15 mL Per Tube Daily  . pantoprazole sodium  40 mg Per Tube QHS  . sodium chloride flush  10-40 mL Intracatheter Q12H    Continuous Infusions: . sodium chloride 10 mL/hr at 01/06/17 2000  . feeding supplement (VITAL HIGH PROTEIN) 1,000 mL (01/07/17 0756)    PRN Meds: sodium chloride flush  Physical Exam    Constitutional:  She appears frail and thin. HENT:  Intubated on vent. Eyes closed. Neck:  Central line in place, dressing CDI  Cardiovascular: Normal rate and regular rhythm.   Pulmonary/Chest:  On vent. Lungs clear.  Abdominal: Soft. Bowel sounds are normal.  PEG in place, site benign  Musculoskeletal:  Contractures  of BUE Neurological:  Unresponsive  Skin: Skin is warm and dry. There is pallor.        Vital Signs: BP (!) 79/50   Pulse 81   Temp 99.3 F (37.4 C) (Oral)   Resp (!) 21   Ht 5' 2"  (1.575 m)   Wt 41.7 kg (92 lb)   SpO2 100%   BMI 16.83 kg/m  SpO2: SpO2: 100 % O2 Device: O2 Device: Ventilator O2 Flow Rate: O2 Flow Rate (L/min): 15 L/min  Intake/output summary:  Intake/Output Summary (Last 24 hours) at 01/07/17 7673 Last data filed at 01/07/17 0700  Gross per 24 hour  Intake             1210 ml  Output              720 ml  Net              490 ml   LBM: Last BM Date: 01/06/17 Baseline Weight: Weight: 59 kg (130 lb) Most recent weight: Weight: 41.7 kg (92 lb)  Palliative Assessment/Data: PPS 10%   Flowsheet Rows     Most Recent Value  Intake Tab  Referral Department  -- [ED]  Unit at Time of Referral  ER  Palliative Care Primary Diagnosis  Cardiac  Date Notified  12/29/16  Palliative Care Type  New Palliative  care  Reason for referral  Clarify Goals of Care  Date of Admission  12/29/16  Date first seen by Palliative Care  12/30/16  # of days Palliative referral response time  1 Day(s)  # of days IP prior to Palliative referral  0  Clinical Assessment  Psychosocial & Spiritual Assessment  Palliative Care Outcomes     Patient Active Problem List   Diagnosis Date Noted  . Pressure injury of skin 01/01/2017  . Cerebral thrombosis with cerebral infarction 01/01/2017  . Cerebral embolism with cerebral infarction 01/01/2017  . Subarachnoid hemorrhage 01/01/2017  . Intracerebral hemorrhage 01/01/2017  . Encounter for central line placement   . Respiratory arrest ([place])   . Goals of care, counseling/discussion   . Palliative care by specialist   . Acute respiratory failure ([place])   . Cardiac arrest [facility]) 12/29/2016    Palliative Care Assessment & Plan   HPI: 83 y.o. female  with past medical history of colon cancer (in remission), ischemic strokes and  subarachnoid hemorrhage, s/p PEG d/t dysphagia, partial complex seizures, sacral decubitous stage III, and Mallory-Weiss tear. She presented to the ED from home after respiratory arrest followed by cardiac arrest. EMS on arrival found her pulseless and she required CPR and epinephrine before ROSC. She was admitted on 12/29/2016. Etiology remains unclear at this point, though aspiration is in the differential. Palliative consulted to assist in goals of care; she has two sons and one daughter who cooperatively make decisions.   Assessment: I initially met with Demaya's family on 6/27 shortly after admission. In that conversation we talked through her health prior to admission, and her health presently. Her family had a very good understanding of the seriousness of her condition, however they were very focused on improvement and eventual recovery. Along those lines, they wanted to pursue all life prolonging measures necessary to afford her the time to improve, which they accept may be a lengthy process. When I gently touched on the possibility that she may not improve back to her prior mental or functional status, they were not able to engage this possibility at that point.   Today, I reengaged the family to follow-up on goals of care. She is now nine days since admission and has been unable to wean off the vent. There have been multiple discussion with the family/CCM/Neurology surrounding trach versus withdrawal of aggressive interventions. Unfortunately, there is unclear family consensus about the trajectory of care, which is why I revisited today. Dr. Vaughan Browner discussed prognosis with son at bedside, and recommended against trach in favor of transition to comfort measures. The son clearly needed to discuss this with his family. Given the significance of the discussion, a family meeting with CCM and Palliative was recommended. He is going to reach out to his family and contact me with a time to meet.   ADDENDUM  1230: I returned to the room to meet with the patient's daughter. She and her brother had talked and she wanted to touch base with me. I talked her through the recommendations from the teams' perspective, which was against trach placement given her low likelihood of recovery, indefinite vent dependence, and risk for prolonged suffering. [name] was very receptive to this information, but reiterated the family's belief that there were signs of improvement (even if not from a respiratory standpoint), and they still believe that with time she will be able to successfully wean from the vent. Furthermore, if she is not able to come off the vent, they feel  she will find joy in her life even on a ventilator and she would choose to live this way [rather than choosing end of life comfort measures]. They feel strongly that its God's will that she is alive, and death will occur when He calls her (which they take to mean death despite all aggressive interventions).   Of note, the family has been very clear in all my conversations with them about their desire for full scope aggressive care. They have a different perspective on her chances for recovery, given her prior stroke and being told she would never improve (which she did). They also believe in the subjective nature of quality of life and that she has always expressed a desire for full aggressive measures, and that she would still want to live with machines if the alternative was dying without them. They believe they are honoring her expressed wishes for full scope treatment.   Recommendations/Plan:  Full code, full scope treatment  Family does want trach placed   Family interested in [place] placement, CM consulted to assist in this referral  Goals of Care and Additional Recommendations:  Limitations on Scope of Treatment: Full Scope Treatment  Code Status:  Full code  Prognosis:   Unable to determine  Discharge Planning:  [place] was  discussed with pt's son and daughter, care nurse, social work, primary team  Thank you for allowing the Palliative Medicine Team to assist in the care of this patient.  Time in/out: 0930/0945; 1230/1330 Total time: 100 minutes    Greater than 50%  of this time was spent counseling and coordinating care related to the above assessment and plan.  Charlynn Court, NP Palliative Medicine Team [phone] pager (7a-5p) Team Phone # [phone]

## 2017-01-07 NOTE — Progress Notes (Signed)
Pt d/c to [facility]. Report called to Frank. Ventilator and IJ intact. Assessment and vitals stable

## 2017-01-08 ENCOUNTER — Encounter (HOSPITAL_BASED_OUTPATIENT_CLINIC_OR_DEPARTMENT_OTHER): Payer: Medicare Other

## 2017-02-07 ENCOUNTER — Other Ambulatory Visit
Admission: RE | Admit: 2017-02-07 | Discharge: 2017-02-07 | Disposition: A | Discharge status: Home / Self Care | Payer: Medicare Other | Source: Skilled Nursing Facility | Attending: *Deleted | Admitting: *Deleted

## 2017-02-07 ENCOUNTER — Other Ambulatory Visit: Admission: RE | Admit: 2017-02-07 | Payer: Self-pay | Source: Ambulatory Visit | Admitting: *Deleted

## 2017-02-07 LAB — CBC WITH DIFFERENTIAL/PLATELET
BASOS ABS: 0 10*3/uL (ref 0–0.1)
Band Neutrophils: 3 %
Basophils Relative: 0 %
Blasts: 0 %
EOS PCT: 3 %
Eosinophils Absolute: 0.5 10*3/uL (ref 0–0.7)
HEMATOCRIT: 25.6 % — AB (ref 35.0–47.0)
HEMOGLOBIN: 7.6 g/dL — AB (ref 12.0–16.0)
LYMPHS ABS: 2.2 10*3/uL (ref 1.0–3.6)
Lymphocytes Relative: 13 %
MCH: 25 pg — AB (ref 26.0–34.0)
MCHC: 29.8 g/dL — AB (ref 32.0–36.0)
MCV: 83.9 fL (ref 80.0–100.0)
METAMYELOCYTES PCT: 1 %
MONOS PCT: 1 %
Monocytes Absolute: 0.2 10*3/uL (ref 0.2–0.9)
Myelocytes: 1 %
NEUTROS ABS: 13.8 10*3/uL — AB (ref 1.4–6.5)
NRBC: 0 /100{WBCs}
Neutrophils Relative %: 78 %
Other: 0 %
Platelets: 325 10*3/uL (ref 150–440)
Promyelocytes Absolute: 0 %
RBC: 3.05 MIL/uL — AB (ref 3.80–5.20)
RDW: 29 % — ABNORMAL HIGH (ref 11.5–14.5)
Smear Review: ADEQUATE
WBC: 16.7 10*3/uL — AB (ref 3.6–11.0)

## 2017-02-07 LAB — COMPREHENSIVE METABOLIC PANEL
ALK PHOS: 109 U/L (ref 38–126)
ALT: 19 U/L (ref 14–54)
ANION GAP: 17 — AB (ref 5–15)
AST: 50 U/L — ABNORMAL HIGH (ref 15–41)
Albumin: 1.4 g/dL — ABNORMAL LOW (ref 3.5–5.0)
BILIRUBIN TOTAL: 0.4 mg/dL (ref 0.3–1.2)
BUN: 46 mg/dL — ABNORMAL HIGH (ref 6–20)
CO2: 32 mmol/L (ref 22–32)
CREATININE: 0.45 mg/dL (ref 0.44–1.00)
Calcium: 7.7 mg/dL — ABNORMAL LOW (ref 8.9–10.3)
Chloride: 94 mmol/L — ABNORMAL LOW (ref 101–111)
Glucose, Bld: 186 mg/dL — ABNORMAL HIGH (ref 65–99)
Potassium: 4.8 mmol/L (ref 3.5–5.1)
Sodium: 143 mmol/L (ref 135–145)
Total Protein: 5.3 g/dL — ABNORMAL LOW (ref 6.5–8.1)

## 2017-02-07 LAB — TROPONIN I: TROPONIN I: 0.1 ng/mL — AB (ref <0.03)

## 2017-02-07 LAB — CKMB (ARMC ONLY): CK, MB: 6.5 ng/mL — AB (ref 0.5–5.0)

## 2017-03-04 DIAGNOSIS — I441 Atrioventricular block, second degree: Secondary | ICD-10-CM | POA: Diagnosis not present

## 2017-03-06 DEATH — deceased

## 2018-04-24 IMAGING — DX DG CHEST 1V PORT
1 series · 1 of 1 positions shown · non-contrast
Comparison: 01/02/2017

CLINICAL DATA: Acute renal failure.  Ventilator support.

EXAM:
PORTABLE CHEST 1 VIEW

[chest ap]
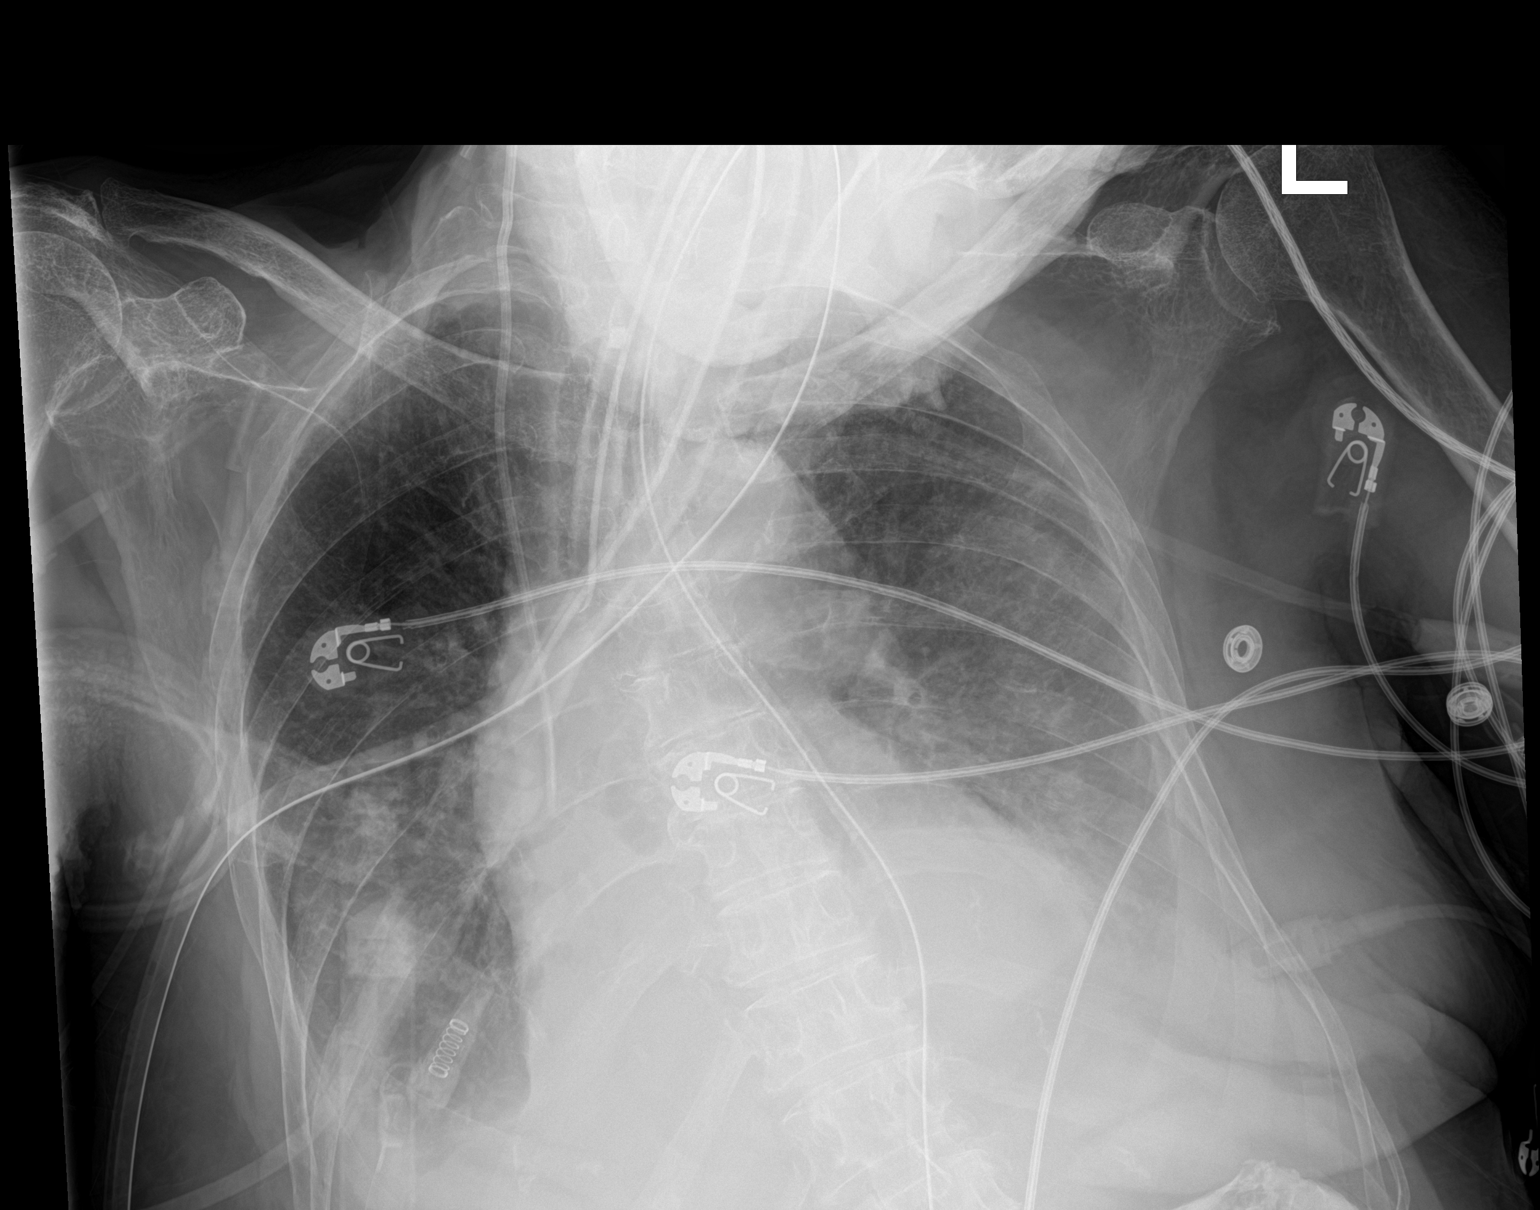

[1 of 1 positions shown; findings below may reference images not displayed]

FINDINGS: Endotracheal tube tip is 2 cm above the carina. Nasogastric tube
enters the abdomen. Right internal jugular central line tip is in
the SVC above the right atrium. Persistent mild pulmonary edema with
pleural effusions and lower lobe atelectasis. No new finding.
IMPRESSION: Lines and tubes well positioned. Persistent edema pattern with
bilateral effusions and lower lobe volume loss.

## 2018-04-25 IMAGING — DX DG CHEST 1V PORT
1 series · 1 of 1 positions shown · non-contrast
Comparison: January 03, 2017

CLINICAL DATA: Hypoxia

EXAM:
PORTABLE CHEST 1 VIEW

[chest ap]
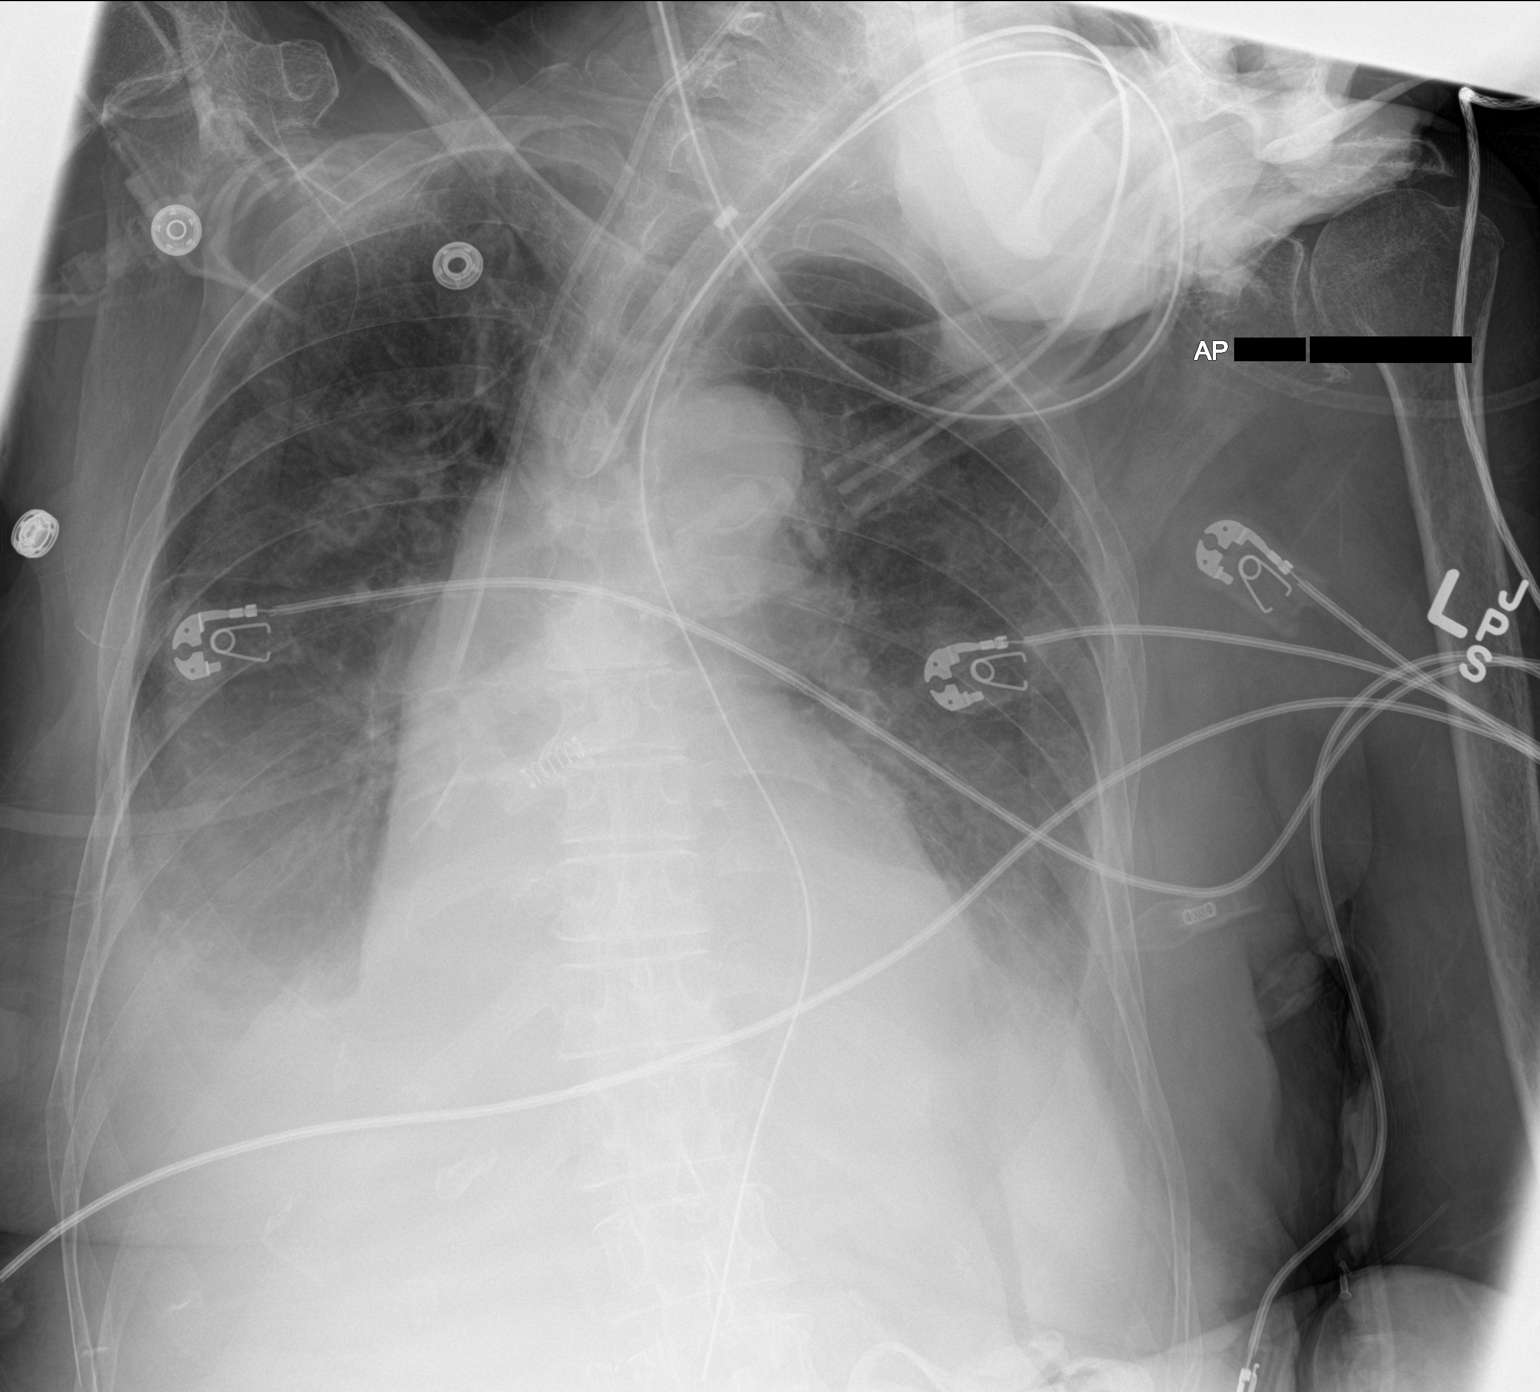

[1 of 1 positions shown; findings below may reference images not displayed]

FINDINGS: Endotracheal tube tip is 2.4 cm above the carina. Central catheter
tip is in the superior vena cava. Nasogastric tube tip and side port
are below the diaphragm. No pneumothorax. There are pleural
effusions bilaterally with patchy consolidation in both lung bases.
There is moderate interstitial edema, stable. Heart is borderline
enlarged with pulmonary vascularity within normal limits. No
adenopathy. There is aortic atherosclerosis. No appreciable bone
lesions.
IMPRESSION: Tube and catheter positions as described without pneumothorax.
Evidence a degree of congestive heart failure. Superimposed
pneumonia in the bases cannot be excluded radiographically. Cardiac
silhouette is stable. There is aortic atherosclerosis.

## 2018-04-26 IMAGING — DX DG CHEST 1V PORT
1 series · 1 of 1 positions shown · non-contrast
Comparison: 01/04/2017 .

CLINICAL DATA: Intubation .

EXAM:
PORTABLE CHEST 1 VIEW

[chest ap]
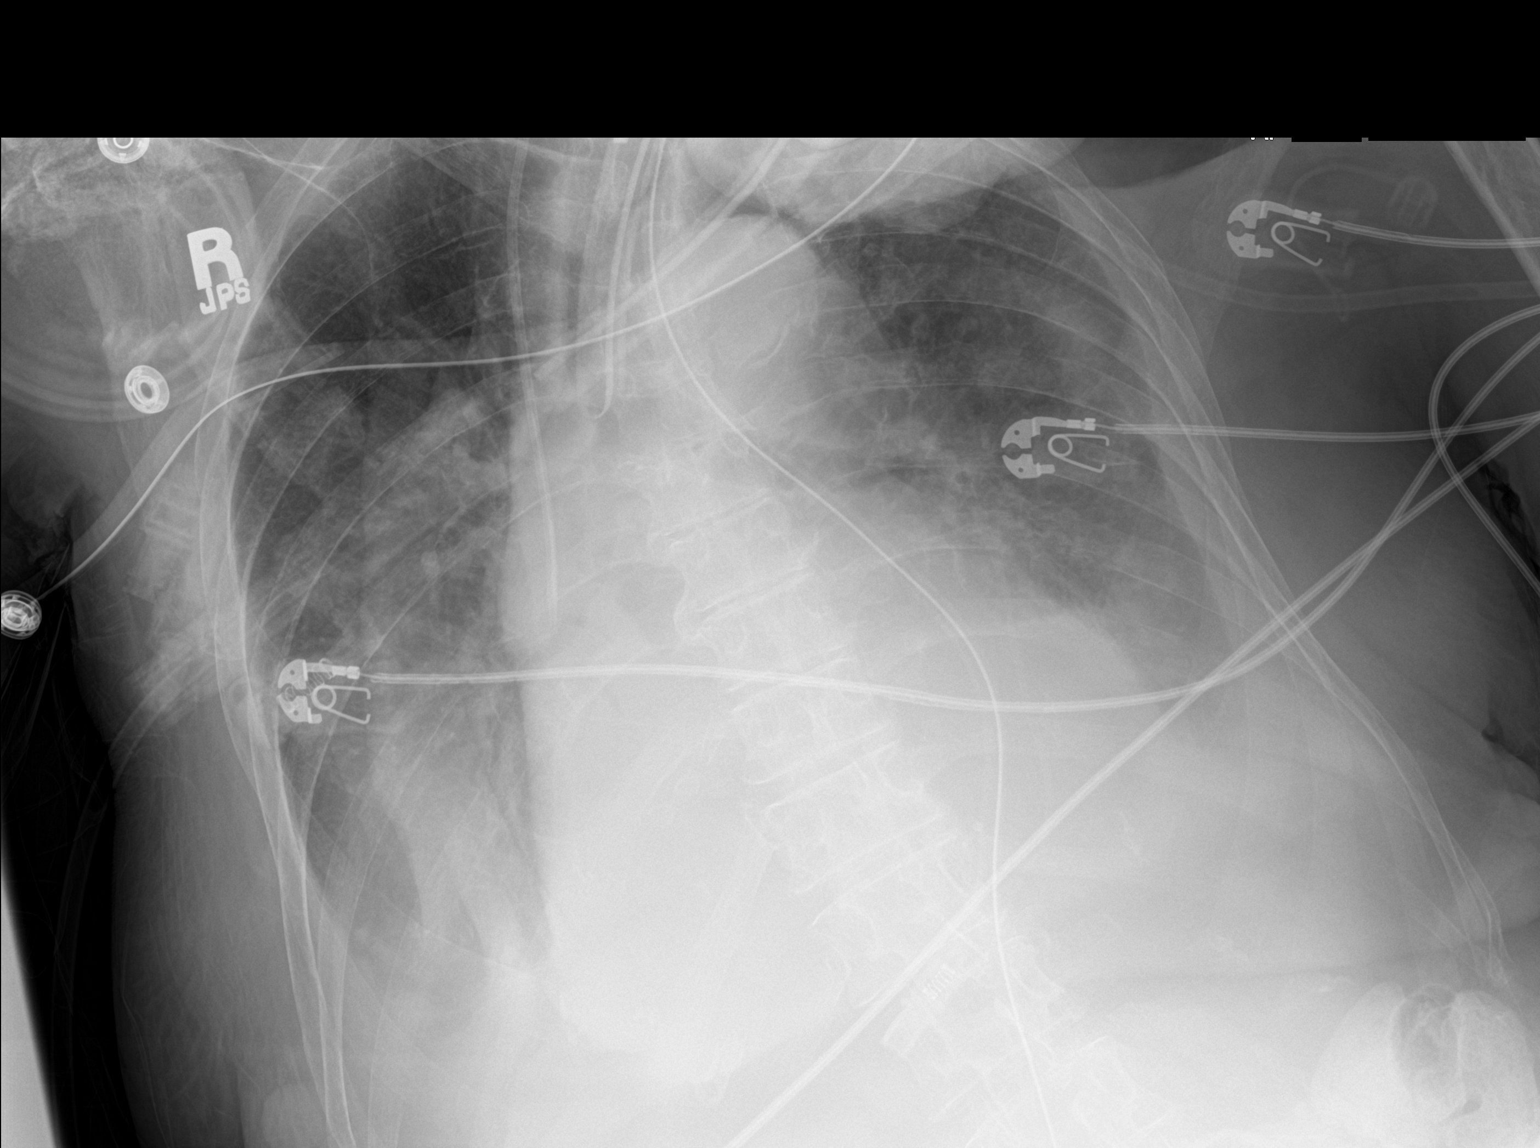

[1 of 1 positions shown; findings below may reference images not displayed]

FINDINGS: Endotracheal tube, NG tube, right IJ line stable position. Stable
cardiomegaly. Diffuse bilateral pulmonary infiltrates consistent
pulmonary edema again noted. Bilateral pleural effusions again
noted. No significant change. Low lung volumes. No pneumothorax.
IMPRESSION: 1. Lines and tubes stable position.

2. Cardiomegaly with bilateral pulmonary edema and bilateral pleural
effusions, no change from prior exam.

3. Low lung volumes.

## 2018-04-27 IMAGING — DX DG CHEST 1V PORT
1 series · 1 of 1 positions shown · non-contrast
Comparison: 01/05/2017

CLINICAL DATA: Intubated

EXAM:
PORTABLE CHEST 1 VIEW

[chest]
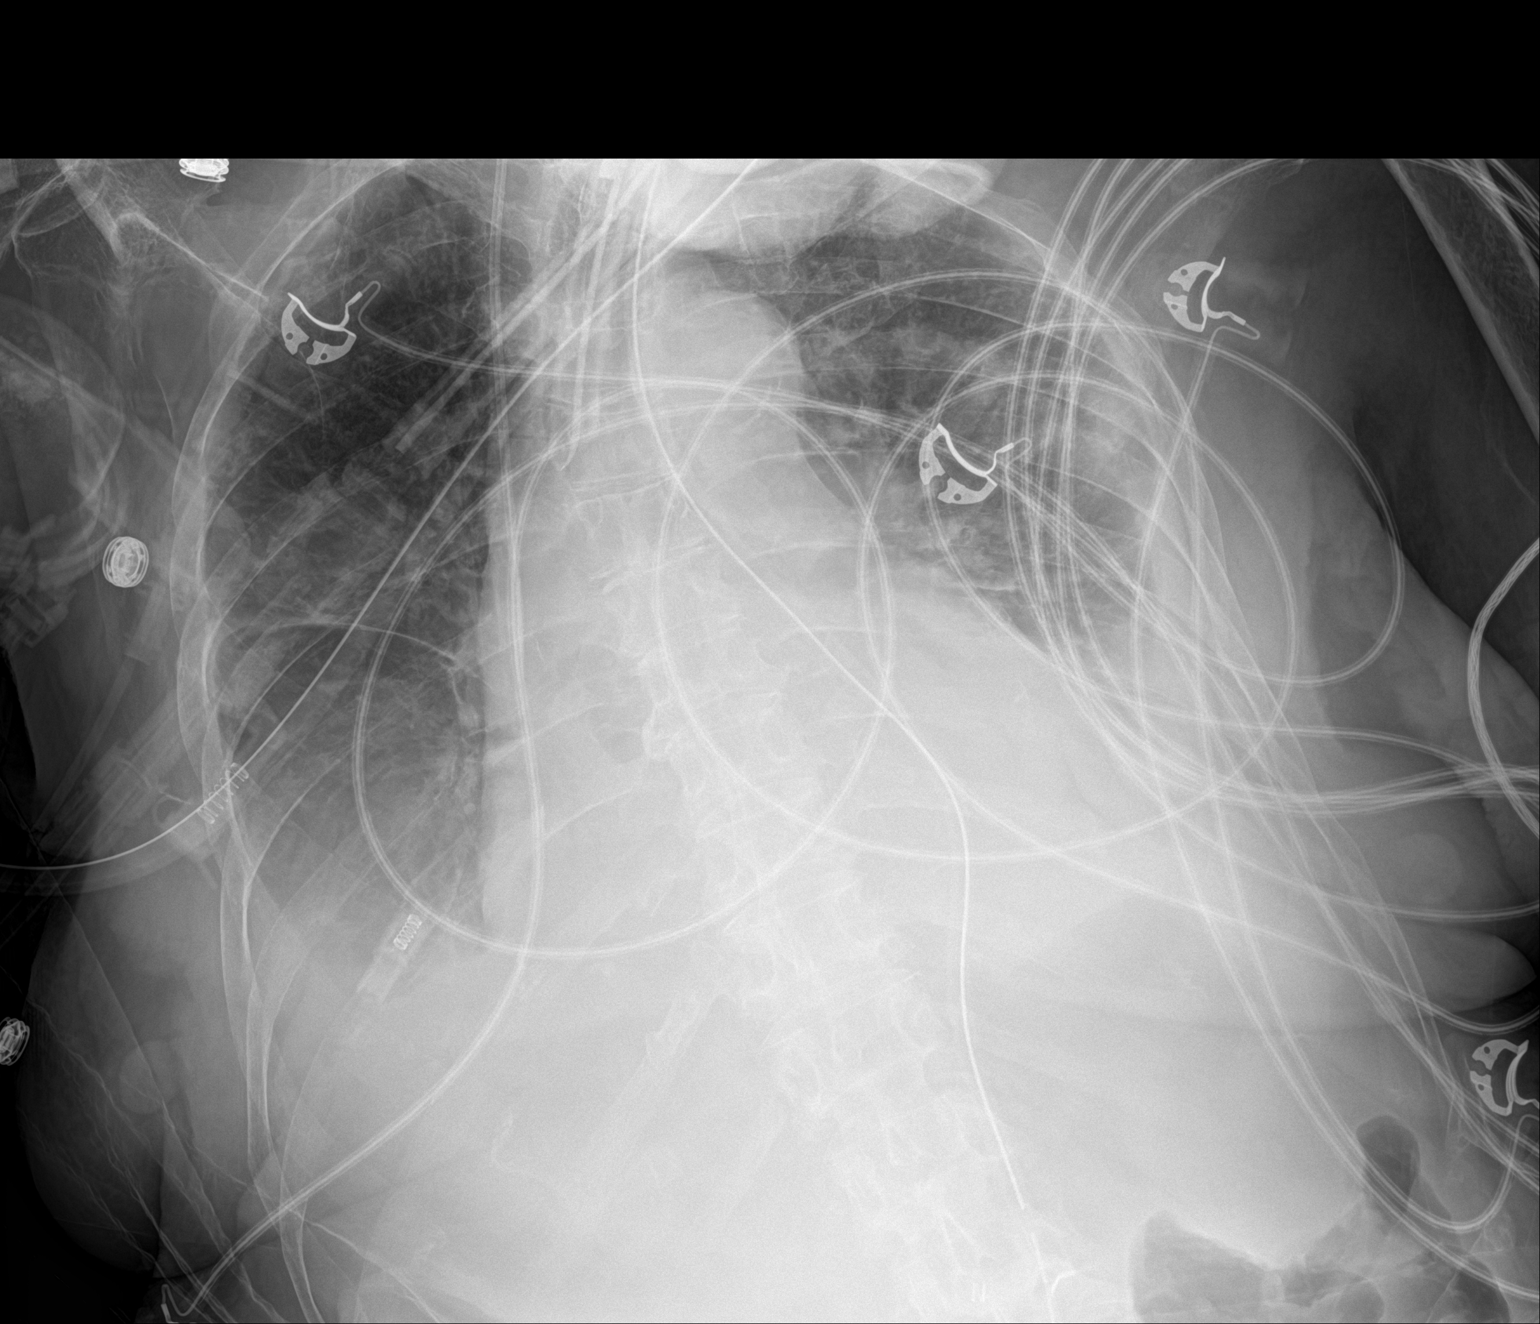

[1 of 1 positions shown; findings below may reference images not displayed]

FINDINGS: Support devices are stable. Cardiomegaly with vascular congestion.
Layering bilateral effusions and bilobed bilateral airspace
opacities, unchanged.
IMPRESSION: No significant change bilateral airspace disease and layering
effusions.

## 2018-04-28 IMAGING — DX DG CHEST 1V PORT
1 series · 1 of 1 positions shown · non-contrast
Comparison: One day prior

CLINICAL DATA: ETT.HX STROKE,COLON CA

EXAM:
PORTABLE CHEST 1 VIEW

[chest ap]
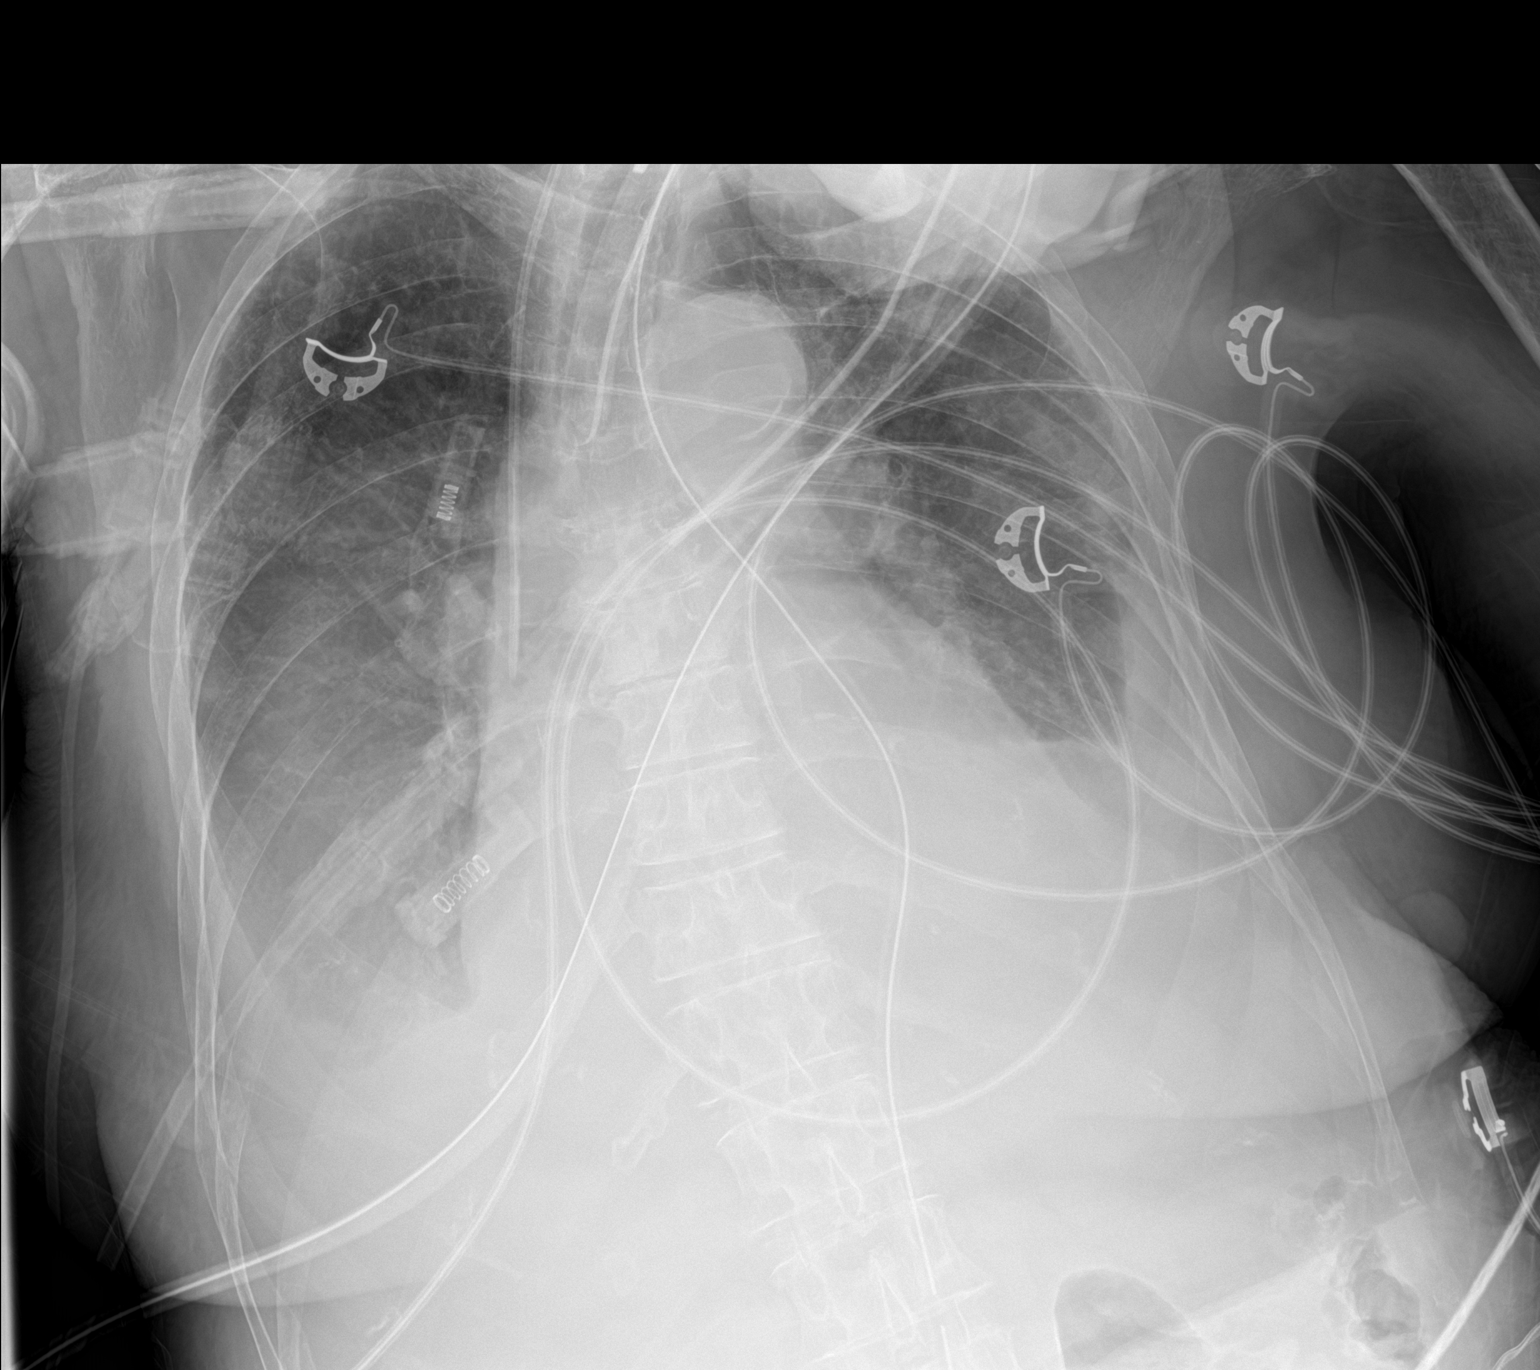

[1 of 1 positions shown; findings below may reference images not displayed]

FINDINGS: Endotracheal tube terminates 2.3 Cm above carina.nasogastric tube
extends beyond the inferior aspect of the film. Right internal
jugular line tip at low SVC. Cardiomegaly accentuated by AP portable
technique. Small bilateral pleural effusions are similar. There may
be mild loculation involving the left-sided effusion. The Chin
overlies the left apex. No pneumothorax. Interstitial edema is
moderate and similar. Left greater than right base airspace disease
is not significantly changed. Atherosclerosis in the transverse
aorta.
IMPRESSION: No significant change since one day prior.

Congestive heart failure with bilateral pleural effusions and
bibasilar Airspace disease, likely atelectasis.
